# Patient Record
Sex: Female | Born: 1975 | Race: White | Hispanic: No | Marital: Married | Smoking: Former smoker
Health system: Southern US, Community
[De-identification: ages and names within clinical notes are randomized; demographics above are authoritative.]

## PROBLEM LIST (undated history)

## (undated) DIAGNOSIS — F419 Anxiety disorder, unspecified: Secondary | ICD-10-CM

## (undated) HISTORY — PX: OTHER SURGICAL HISTORY: SHX169

## (undated) HISTORY — PX: TUBAL LIGATION: SHX77

## (undated) HISTORY — PX: KNEE SURGERY: SHX244

---

## 2008-12-26 HISTORY — PX: BLADDER SURGERY: SHX569

## 2009-06-01 ENCOUNTER — Ambulatory Visit: Payer: Self-pay | Admitting: Family Medicine

## 2009-06-01 DIAGNOSIS — N200 Calculus of kidney: Secondary | ICD-10-CM | POA: Insufficient documentation

## 2009-06-01 DIAGNOSIS — F339 Major depressive disorder, recurrent, unspecified: Secondary | ICD-10-CM

## 2009-06-01 DIAGNOSIS — R51 Headache: Secondary | ICD-10-CM

## 2009-06-01 DIAGNOSIS — R519 Headache, unspecified: Secondary | ICD-10-CM | POA: Insufficient documentation

## 2009-06-23 ENCOUNTER — Ambulatory Visit: Payer: Self-pay | Admitting: Family Medicine

## 2009-06-23 DIAGNOSIS — Z87891 Personal history of nicotine dependence: Secondary | ICD-10-CM | POA: Insufficient documentation

## 2009-06-23 DIAGNOSIS — E669 Obesity, unspecified: Secondary | ICD-10-CM

## 2009-06-24 ENCOUNTER — Telehealth: Payer: Self-pay | Admitting: Family Medicine

## 2009-06-24 LAB — CONVERTED CEMR LAB
ALT: 15 units/L (ref 0–35)
Albumin: 3.7 g/dL (ref 3.5–5.2)
Basophils Relative: 0 % (ref 0.0–3.0)
CO2: 27 meq/L (ref 19–32)
Chloride: 108 meq/L (ref 96–112)
Cholesterol: 247 mg/dL — ABNORMAL HIGH (ref 0–200)
Creatinine, Ser: 0.9 mg/dL (ref 0.4–1.2)
Direct LDL: 211.6 mg/dL
Eosinophils Absolute: 0.3 10*3/uL (ref 0.0–0.7)
Eosinophils Relative: 3 % (ref 0.0–5.0)
HCT: 40.8 % (ref 36.0–46.0)
Hemoglobin: 14.2 g/dL (ref 12.0–15.0)
Lymphs Abs: 2.7 10*3/uL (ref 0.7–4.0)
MCHC: 34.7 g/dL (ref 30.0–36.0)
MCV: 89.6 fL (ref 78.0–100.0)
Monocytes Absolute: 0.6 10*3/uL (ref 0.1–1.0)
Neutro Abs: 7.5 10*3/uL (ref 1.4–7.7)
Neutrophils Relative %: 67.9 % (ref 43.0–77.0)
Potassium: 3.9 meq/L (ref 3.5–5.1)
RBC: 4.56 M/uL (ref 3.87–5.11)
TSH: 1.47 microintl units/mL (ref 0.35–5.50)
Total CHOL/HDL Ratio: 7
Total Protein: 6.8 g/dL (ref 6.0–8.3)
WBC: 11.1 10*3/uL — ABNORMAL HIGH (ref 4.5–10.5)

## 2009-07-20 ENCOUNTER — Telehealth: Payer: Self-pay | Admitting: Family Medicine

## 2009-08-14 ENCOUNTER — Telehealth: Payer: Self-pay | Admitting: *Deleted

## 2009-09-07 ENCOUNTER — Telehealth: Payer: Self-pay | Admitting: Family Medicine

## 2009-10-23 ENCOUNTER — Ambulatory Visit (HOSPITAL_COMMUNITY): Admission: RE | Admit: 2009-10-23 | Discharge: 2009-10-23 | Payer: Self-pay | Admitting: Obstetrics and Gynecology

## 2010-03-17 ENCOUNTER — Ambulatory Visit: Payer: Self-pay | Admitting: Family Medicine

## 2010-03-17 DIAGNOSIS — E785 Hyperlipidemia, unspecified: Secondary | ICD-10-CM

## 2010-03-17 DIAGNOSIS — I498 Other specified cardiac arrhythmias: Secondary | ICD-10-CM

## 2010-03-17 DIAGNOSIS — J45909 Unspecified asthma, uncomplicated: Secondary | ICD-10-CM | POA: Insufficient documentation

## 2010-03-17 LAB — CONVERTED CEMR LAB: Cholesterol, target level: 200 mg/dL

## 2010-03-19 LAB — CONVERTED CEMR LAB
HDL: 43.2 mg/dL (ref >39.00)
TSH: 1.77 microintl units/mL (ref 0.35–5.50)
Total CHOL/HDL Ratio: 5
VLDL: 26.4 mg/dL (ref 0.0–40.0)

## 2010-04-05 LAB — HM PAP SMEAR: HM Pap smear: NEGATIVE

## 2010-06-02 ENCOUNTER — Telehealth: Payer: Self-pay | Admitting: Family Medicine

## 2010-06-22 ENCOUNTER — Telehealth: Payer: Self-pay | Admitting: Family Medicine

## 2010-07-21 ENCOUNTER — Ambulatory Visit: Payer: Self-pay | Admitting: Family Medicine

## 2010-07-21 DIAGNOSIS — R300 Dysuria: Secondary | ICD-10-CM

## 2010-07-21 LAB — CONVERTED CEMR LAB
Bilirubin Urine: NEGATIVE
Glucose, Urine, Semiquant: NEGATIVE
Protein, U semiquant: NEGATIVE
pH: 5

## 2010-07-22 ENCOUNTER — Encounter: Payer: Self-pay | Admitting: Family Medicine

## 2010-10-20 ENCOUNTER — Telehealth: Payer: Self-pay | Admitting: Family Medicine

## 2011-01-25 NOTE — Assessment & Plan Note (Signed)
Summary: UTI/dm   Vital Signs:  Patient profile:   35 year old female Menstrual status:  regular Temp:     98.8 degrees F oral BP sitting:   120 / 80  (left arm) Cuff size:   regular  Vitals Entered By: Sid Falcon LPN (July 21, 2010 2:51 PM) CC: UTI pressure, pain   History of Present Illness: Patient relates 2 weeks ago possible recurrent left-sided kidney stone. She had left flank pain that was acute for about 2 days. That pain eventually subsided. Past few days burning with urination and some urgency. Temperature up to 101.3 last night. No fevers or chills. No nausea or vomiting. She has a brother who works with EMS and he gave her apparently 2 L of fluid last night overall feels slightly better.  Allergies: 1)  ! Ketorolac Tromethamine (Ketorolac Tromethamine) 2)  ! Dilaudid (Hydromorphone Hcl)  Past History:  Past Medical History: Last updated: 03/17/2010 Arthritis chicken pox Depression Headache/Migraines Hay fever/allergies kidney stones UTI Hyperlipidemia  Review of Systems  The patient denies fever, abdominal pain, hematuria, and incontinence.    Physical Exam  General:  Well-developed,well-nourished,in no acute distress; alert,appropriate and cooperative throughout examination Mouth:  Oral mucosa and oropharynx without lesions or exudates.  Teeth in good repair. Lungs:  Normal respiratory effort, chest expands symmetrically. Lungs are clear to auscultation, no crackles or wheezes. Heart:  Normal rate and regular rhythm. S1 and S2 normal without gallop, murmur, click, rub or other extra sounds. Msk:  no CVA tenderness   Impression & Recommendations:  Problem # 1:  DYSURIA (ICD-788.1) ?recent recurrent nephrolithiasis.  Rule out UTI.  Urine cx sent.  Cover with macrobid. Orders: UA Dipstick w/o Micro (manual) (41324) T-Culture, Urine ([id number])  Her updated medication list for this problem includes:    Macrobid 100 Mg Caps (Nitrofurantoin monohyd  macro) ..... One by mouth two times a day for 7 days  Problem # 2:  NEPHROLITHIASIS (ICD-592.0) If symptoms not resolved by next week may need to consider renal imaging.  Complete Medication List: 1)  Flovent Hfa 44 Mcg/act Aero (Fluticasone propionate  hfa) .... 2 puffs bid 2)  Zyrtec Allergy 10 Mg Tabs (Cetirizine hcl) .... Once daily 3)  Chantix Starting Month Pak 0.5 Mg X 11 & 1 Mg X 42 Tabs (Varenicline tartrate) .... Use as directed 4)  Chantix Continuing Month Pak 1 Mg Tabs (Varenicline tartrate) .... Use as directed 5)  Ventolin Hfa 108 (90 Base) Mcg/act Aers (Albuterol sulfate) .... 2 puffs every hour for 4 hours as needed for cough or wheezing 6)  Macrobid 100 Mg Caps (Nitrofurantoin monohyd macro) .... One by mouth two times a day for 7 days  Patient Instructions: 1)  Drink plenty of fluids up to 3-4 quarts a day. Cranberry juice is especially recommended in addition to large amounts of water. Avoid caffeine & carbonated drinks, they tend to irritate the bladder, Return in 3-5 days if you're not better: sooner if you're feeling worse.  2)  Touch base by next week if left flank pain has not fully resolved Prescriptions: MACROBID 100 MG CAPS (NITROFURANTOIN MONOHYD MACRO) one by mouth two times a day for 7 days  #14 x 0   Entered and Authorized by:   Evelena Peat MD   Signed by:   Evelena Peat MD on 07/21/2010   Method used:   Electronically to        [facility]. [phone]* (retail)  [address]       [place], [place]  [address]       Ph: [phone]       Fax: [phone]   RxID:   [id number]   Laboratory Results   Urine Tests    Routine Urinalysis   Color: lt. yellow Appearance: Clear Glucose: negative   (Normal Range: Negative) Bilirubin: negative   (Normal Range: Negative) Ketone: negative   (Normal Range: Negative) Spec. Gravity: <1.005   (Normal Range: 1.003-1.035) Blood: negative   (Normal Range:  Negative) pH: 5.0   (Normal Range: 5.0-8.0) Protein: negative   (Normal Range: Negative) Urobilinogen: 0.2   (Normal Range: 0-1) Nitrite: negative   (Normal Range: Negative) Leukocyte Esterace: small   (Normal Range: Negative)    Comments: Sid Falcon LPN  July 21, 2010 3:01 PM

## 2011-01-25 NOTE — Assessment & Plan Note (Signed)
Summary: med check/refills/cjr/pt rescd//ccm   Vital Signs:  Patient profile:   35 year old female Menstrual status:  regular Temp:     98. degrees F oral Pulse rate:   112 / minute Pulse rhythm:   regular BP sitting:   120 / 70  (left arm) Cuff size:   regular  Vitals Entered By: Sid Falcon LPN (March 17, 2010 2:12 PM) CC: Med refills, resting rapid heartrate check, left hand clot, Lipid Management   History of Present Illness: Patient here for followup multiple items as follows.  History of asthma which has been well-controlled on Advair. Uses Xopenex p.r.n. Continues to smoke. Has been able to reduce her Advair to once daily. No recent respiratory infections. Denies any dyspnea.  Recently had bladder surgery. Was noted during anesthesia that her elevated heart rate. Resting heart rate over 100 recently. Has lost some weight about 25 pounds but due to her efforts. No diarrhea no tremors. No chest pains or dizziness.  Significant caffeine consumption at baseline. Continues to smoke.  History hyperlipidemia. LDL over 200 last year. Has not been rechecked since losing weight.  Lipid Management History:      Positive NCEP/ATP III risk factors include HDL cholesterol less than 40 and current tobacco user.  Negative NCEP/ATP III risk factors include female age less than 56 years old, no history of early menopause without estrogen hormone replacement, non-diabetic, non-hypertensive, no ASHD (atherosclerotic heart disease), no prior stroke/TIA, no peripheral vascular disease, and no history of aortic aneurysm.     Allergies: 1)  ! Ketorolac Tromethamine (Ketorolac Tromethamine) 2)  ! Dilaudid (Hydromorphone Hcl)  Past History:  Past Surgical History: Last updated: 06/01/2009 Right shoulder joint  Family History: Last updated: 06/01/2009 Family History of Arthritis Family history ovary cancer Family history breast cancer Family History High cholesterol Family History  Hypertension Family history diabetes  Social History: Last updated: 06/01/2009 Occupation: [employer] Married Current Smoker Alcohol use-no  Risk Factors: Smoking Status: current (06/01/2009)  Past Medical History: Arthritis chicken pox Depression Headache/Migraines Hay fever/allergies kidney stones UTI Hyperlipidemia PMH-FH-SH reviewed for relevance  Review of Systems  The patient denies anorexia, fever, weight gain, vision loss, hoarseness, chest pain, syncope, dyspnea on exertion, peripheral edema, prolonged cough, headaches, hemoptysis, abdominal pain, melena, hematochezia, and severe indigestion/heartburn.    Physical Exam  General:  Well-developed,well-nourished,in no acute distress; alert,appropriate and cooperative throughout examination Head:  Normocephalic and atraumatic without obvious abnormalities. No apparent alopecia or balding. Eyes:  pupils equal, pupils round, and pupils reactive to light.   Ears:  External ear exam shows no significant lesions or deformities.  Otoscopic examination reveals clear canals, tympanic membranes are intact bilaterally without bulging, retraction, inflammation or discharge. Hearing is grossly normal bilaterally. Mouth:  Oral mucosa and oropharynx without lesions or exudates.  Teeth in good repair. Neck:  No deformities, masses, or tenderness noted. Lungs:  Normal respiratory effort, chest expands symmetrically. Lungs are clear to auscultation, no crackles or wheezes. Heart:  normal rate, regular rhythm, no murmur, and no gallop.   Extremities:  no edema   Impression & Recommendations:  Problem # 1:  HYPERLIPIDEMIA (ICD-272.4) recheck labs.  hopefully improved with weight loss. Orders: TLB-Lipid Panel ([id number])  Problem # 2:  ASTHMA (ICD-493.90) Assessment: Unchanged  Her updated medication list for this problem includes:    Advair Diskus 100-50 Mcg/dose Misc (Fluticasone-salmeterol) ..... One to two puffs daily as  directed    Xopenex Hfa 45 Mcg/act Aero (Levalbuterol tartrate) .Marland Kitchen... As needed  Problem # 3:  ATRIAL TACHYCARDIA (ICD-427.89) Mild. recheck TSH. Orders: TLB-TSH (Thyroid Stimulating Hormone) ([id number])  Complete Medication List: 1)  Advair Diskus 100-50 Mcg/dose Misc (Fluticasone-salmeterol) .... One to two puffs daily as directed 2)  Zyrtec Allergy 10 Mg Tabs (Cetirizine hcl) .... Once daily 3)  Xopenex Hfa 45 Mcg/act Aero (Levalbuterol tartrate) .... As needed 4)  Chantix Starting Month Pak 0.5 Mg X 11 & 1 Mg X 42 Tabs (Varenicline tartrate) .... Use as directed 5)  Chantix Continuing Month Pak 1 Mg Tabs (Varenicline tartrate) .... Use as directed  Lipid Assessment/Plan:      Based on NCEP/ATP III, the patient's risk factor category is "2 or more risk factors and a calculated 10 year CAD risk of > 20%".  The patient's lipid goals are as follows: Total cholesterol goal is 200; LDL cholesterol goal is 130; HDL cholesterol goal is 40; Triglyceride goal is 150.    Patient Instructions: 1)  Stop smoking tips: Choose a quit date. Cut down before the quit date. Decide what you will do as a substitute when you feel the urge to smoke(gum, toothpick, exercise).  2)  gradually reduce caffeine use. 3)  It is important that you exercise reguarly at least 20 minutes 5 times a week. If you develop chest pain, have severe difficulty breathing, or feel very tired, stop exercising immediately and seek medical attention.  Prescriptions: XOPENEX HFA 45 MCG/ACT AERO (LEVALBUTEROL TARTRATE) as needed  #15.0 Gram x 2   Entered and Authorized by:   Evelena Peat MD   Signed by:   Evelena Peat MD on 03/17/2010   Method used:   Electronically to        [facility]. [phone]* (retail)       [address]       [place], [place]  [address]       Ph: [phone]       Fax: [phone]   RxID:   [id number] ADVAIR DISKUS 100-50 MCG/DOSE MISC (FLUTICASONE-SALMETEROL)  one to two puffs daily as directed  #180.0 Each x 2   Entered and Authorized by:   Evelena Peat MD   Signed by:   Evelena Peat MD on 03/17/2010   Method used:   Electronically to        [facility]. [phone]* (retail)       [address]       [place], [place]  [address]       Ph: [phone]       Fax: [phone]   RxID:   [id number]   Appended Document: med check/refills/cjr/pt rescd//ccm Pt informed

## 2011-01-25 NOTE — Progress Notes (Signed)
Summary: advair sample or sub  Phone Note Call from Patient Call back at Home Phone [phone]   Summary of Call: No insurance per company - missed deadline paperwork.  Advair very expensive.  Request sample or sub that's not going to cost her $294.  [facility] [address]  Allergic to opiates Initial call taken by: Rudy Jew, RN,  June 02, 2010 1:38 PM  Follow-up for Phone Call        I recommend switch to Flovent 44 micrograms 2 puffs two times a day and 6 refills. Follow-up by: Evelena Peat MD,  June 02, 2010 1:41 PM  Additional Follow-up for Phone Call Additional follow up Details #1::        Phone Call Completed Additional Follow-up by: Rudy Jew, RN,  June 02, 2010 3:17 PM    New/Updated Medications: FLOVENT HFA 44 MCG/ACT AERO (FLUTICASONE PROPIONATE  HFA) 2 puffs bid Prescriptions: FLOVENT HFA 44 MCG/ACT AERO (FLUTICASONE PROPIONATE  HFA) 2 puffs bid  #1 x 6   Entered by:   Rudy Jew, RN   Authorized by:   Evelena Peat MD   Signed by:   Rudy Jew, RN on 06/02/2010   Method used:   Electronically to        [facility]. [phone]* (retail)       [address]       [place], [place]  [address]       Ph: [phone]       Fax: [phone]   RxID:   [id number]

## 2011-01-25 NOTE — Progress Notes (Signed)
Summary: med for UTI  Phone Note Call from Patient Call back at Home Phone [phone]   Caller: vm 3:29 Summary of Call: UTI, get them frequently, & due to get on plane & bouncing locations around next 3-4 days.  Can you call in med to [facility].  I can pick it up where I'll be.   Initial call taken by: Rudy Jew, RN,  October 20, 2010 3:43 PM  Follow-up for Phone Call        I did not get chance to review this message until after 5pm. Follow-up by: Evelena Peat MD,  October 20, 2010 8:33 PM  Additional Follow-up for Phone Call Additional follow up Details #1::        Pt still having symptoms, out of town, requesting RX. Sid Falcon LPN  October 22, 2010 12:41 PM     Additional Follow-up for Phone Call Additional follow up Details #2::    macrobid one by mouth two times a day for 5 days. Needs f/u if symptoms persist. Follow-up by: Evelena Peat MD,  October 22, 2010 1:03 PM  Prescriptions: MACROBID 100 MG CAPS (NITROFURANTOIN MONOHYD MACRO) one by mouth two times a day for 7 days  #14 x 0   Entered by:   Sid Falcon LPN   Authorized by:   Evelena Peat MD   Signed by:   Sid Falcon LPN on 24/40/1027   Method used:   Electronically to        [facility]. [phone]* (retail)       [address]       [place], [place]  [address]       Ph: [phone]       Fax: [phone]   RxID:   [id number]

## 2011-01-25 NOTE — Progress Notes (Signed)
Summary: new rx request  Phone Note Call from Patient Call back at Home Phone [phone]   Caller: Patient---live call Summary of Call: On Xopenex.Too expensive has no ins. Would like to change to Albuterol. please send to [facility] on [address]. Would like this asap, please. Initial call taken by: Warnell Forester,  June 22, 2010 1:28 PM  Follow-up for Phone Call        [facility] to change to ventolin or ProAir 2 puffs q 4 hours as needed cough or wheeze.  #1 with 3 refills. Follow-up by: Evelena Peat MD,  June 22, 2010 2:44 PM  Additional Follow-up for Phone Call Additional follow up Details #1::        Phone Call Completed Additional Follow-up by: Kern Reap CMA Duncan Dull),  June 23, 2010 9:20 AM    New/Updated Medications: VENTOLIN HFA 108 (90 BASE) MCG/ACT AERS (ALBUTEROL SULFATE) 2 puffs every hour for 4 hours as needed for cough or wheezing Prescriptions: VENTOLIN HFA 108 (90 BASE) MCG/ACT AERS (ALBUTEROL SULFATE) 2 puffs every hour for 4 hours as needed for cough or wheezing  #1 x 3   Entered by:   Kern Reap CMA (AAMA)   Authorized by:   Evelena Peat MD   Signed by:   Kern Reap CMA (AAMA) on 06/23/2010   Method used:   Electronically to        [facility]. [phone]* (retail)       [address]       [place], [place]  [address]       Ph: [phone]       Fax: [phone]   RxID:   [id number]

## 2011-02-17 ENCOUNTER — Other Ambulatory Visit: Payer: Self-pay | Admitting: Family Medicine

## 2011-03-31 LAB — COMPREHENSIVE METABOLIC PANEL
AST: 21 U/L (ref 0–37)
Albumin: 3.9 g/dL (ref 3.5–5.2)
BUN: 7 mg/dL (ref 6–23)
Chloride: 103 mEq/L (ref 96–112)
Creatinine, Ser: 0.91 mg/dL (ref 0.4–1.2)
GFR calc Af Amer: >60 mL/min (ref >60)
Potassium: 4 mEq/L (ref 3.5–5.1)
Total Bilirubin: 0.5 mg/dL (ref 0.3–1.2)
Total Protein: 7.1 g/dL (ref 6.0–8.3)

## 2011-03-31 LAB — CBC
HCT: 43.9 % (ref 36.0–46.0)
MCV: 90.6 fL (ref 78.0–100.0)
Platelets: 360 10*3/uL (ref 150–400)
RDW: 12.9 % (ref 11.5–15.5)
WBC: 11.9 10*3/uL — ABNORMAL HIGH (ref 4.0–10.5)

## 2011-03-31 LAB — URINALYSIS, ROUTINE W REFLEX MICROSCOPIC
Ketones, ur: NEGATIVE mg/dL
Protein, ur: NEGATIVE mg/dL
Urobilinogen, UA: 0.2 mg/dL (ref 0.0–1.0)

## 2011-03-31 LAB — PREGNANCY, URINE: Preg Test, Ur: NEGATIVE

## 2011-06-06 ENCOUNTER — Encounter: Payer: Self-pay | Admitting: Family Medicine

## 2011-06-06 ENCOUNTER — Ambulatory Visit (INDEPENDENT_AMBULATORY_CARE_PROVIDER_SITE_OTHER): Payer: Self-pay | Admitting: Family Medicine

## 2011-06-06 VITALS — BP 112/80 | HR 104 | Temp 98.2°F | Resp 12

## 2011-06-06 DIAGNOSIS — M25539 Pain in unspecified wrist: Secondary | ICD-10-CM

## 2011-06-06 DIAGNOSIS — J45909 Unspecified asthma, uncomplicated: Secondary | ICD-10-CM

## 2011-06-06 DIAGNOSIS — R52 Pain, unspecified: Secondary | ICD-10-CM

## 2011-06-06 DIAGNOSIS — F410 Panic disorder [episodic paroxysmal anxiety] without agoraphobia: Secondary | ICD-10-CM

## 2011-06-06 MED ORDER — FLUTICASONE-SALMETEROL 250-50 MCG/DOSE IN AEPB: 1.0000 | INHALATION_SPRAY | RESPIRATORY_TRACT | Status: DC

## 2011-06-06 MED ORDER — LORAZEPAM 0.5 MG PO TABS: 0.5000 mg | ORAL_TABLET | Freq: Four times a day (QID) | ORAL | Status: AC | PRN

## 2011-06-06 MED ORDER — SERTRALINE HCL 50 MG PO TABS: 50.0000 mg | ORAL_TABLET | Freq: Every day | ORAL | Status: DC

## 2011-06-06 NOTE — Progress Notes (Signed)
  Subjective:    Patient ID: Amy [name], female    DOB: 1976/03/31, 35 y.o.   MRN: [id number]  HPI Patient seen today for the following issues.  Patient has right wrist pain. Saturday she felt a popping sensation along the volar surface mid aspect of wrist. Since then she's had some right thumb pain and noticed some bruising on the wrist. She went to urgent care and was given a thumb spica splint. No x-rays done. She using Advil and ice. Intermittent numbness involving all fingertips. No neck pain. Pain with wrist extension and flexion. Able to move all digits well.  History of asthma which is apparently mild persistent. Needs refills of Advair which she takes just once daily. Seems to be controlling asthma fairly well. Rare use of Ventolin.  Patient history panic disorder. More frequent episodes recently about one to 2 times per week. Frequent wakes up at night with dyspnea and hyperventilation. Occasional nonexertional chest pain. Has previously been on prevention medicines but not for some time. No recent stressors. No depressive symptoms.   Review of Systems  Constitutional: Negative for appetite change and unexpected weight change.  Respiratory: Negative for cough and shortness of breath.   Cardiovascular: Negative for chest pain.  Gastrointestinal: Negative for abdominal pain.  Musculoskeletal: Negative for back pain.  Skin: Negative for rash.  Neurological: Negative for seizures, syncope and headaches.  Hematological: Negative for adenopathy. Does not bruise/bleed easily.       Objective:   Physical Exam  Constitutional: She is oriented to person, place, and time. She appears well-developed and well-nourished. No distress.  Cardiovascular: Normal rate, regular rhythm and normal heart sounds.   Pulmonary/Chest: Effort normal and breath sounds normal. No respiratory distress. She has no wheezes. She has no rales.  Musculoskeletal: She exhibits no edema.       Right wrist reveals  some mild ecchymosis on the distal ventral aspect of the wrist. She has pain with flexion and extension but no impairment with mobility. She has full range of motion in all digits of the hand. She has pain with abduction and adduction of the right thumb. No palpable deformity. No bony tenderness. No visible swelling. No erythema or warmth.  Neurological: She is alert and oriented to person, place, and time. No cranial nerve deficit.  Psychiatric: She has a normal mood and affect.          Assessment & Plan:  #1 right wrist pain. Suspect tendon injury/strain. She has full range of motion in all digits. Continue thumb spica splint. Continue Advil and icing. Reassess 2 weeks if no better #2 history of panic disorder. Start sertraline 50 mg daily and lorazepam 0.5 mg every 6 hours for severe anxiety symptoms and reassess one month #3 asthma. Stable. Refilled Advair for one year

## 2011-06-30 ENCOUNTER — Ambulatory Visit: Payer: Self-pay | Admitting: Family Medicine

## 2012-01-26 ENCOUNTER — Emergency Department (HOSPITAL_COMMUNITY): Payer: 59

## 2012-01-26 ENCOUNTER — Other Ambulatory Visit: Payer: Self-pay

## 2012-01-26 ENCOUNTER — Encounter (HOSPITAL_COMMUNITY): Payer: Self-pay | Admitting: Emergency Medicine

## 2012-01-26 ENCOUNTER — Emergency Department (HOSPITAL_COMMUNITY)
Admission: EM | Admit: 2012-01-26 | Discharge: 2012-01-27 | Disposition: A | Discharge status: Home / Self Care | Payer: 59 | Attending: Emergency Medicine | Admitting: Emergency Medicine

## 2012-01-26 DIAGNOSIS — R0682 Tachypnea, not elsewhere classified: Secondary | ICD-10-CM | POA: Insufficient documentation

## 2012-01-26 DIAGNOSIS — F411 Generalized anxiety disorder: Secondary | ICD-10-CM | POA: Insufficient documentation

## 2012-01-26 DIAGNOSIS — F17200 Nicotine dependence, unspecified, uncomplicated: Secondary | ICD-10-CM | POA: Insufficient documentation

## 2012-01-26 DIAGNOSIS — R0789 Other chest pain: Secondary | ICD-10-CM | POA: Insufficient documentation

## 2012-01-26 DIAGNOSIS — R0602 Shortness of breath: Secondary | ICD-10-CM | POA: Insufficient documentation

## 2012-01-26 DIAGNOSIS — J45901 Unspecified asthma with (acute) exacerbation: Secondary | ICD-10-CM

## 2012-01-26 DIAGNOSIS — R000 Tachycardia, unspecified: Secondary | ICD-10-CM | POA: Insufficient documentation

## 2012-01-26 DIAGNOSIS — J45909 Unspecified asthma, uncomplicated: Secondary | ICD-10-CM | POA: Insufficient documentation

## 2012-01-26 MED ORDER — IPRATROPIUM BROMIDE 0.02 % IN SOLN
0.5000 mg | Freq: Once | RESPIRATORY_TRACT | Status: DC
  Filled 2012-01-26: qty 2.5

## 2012-01-26 MED ORDER — ALBUTEROL (5 MG/ML) CONTINUOUS INHALATION SOLN
15.0000 mg/h | INHALATION_SOLUTION | Freq: Once | RESPIRATORY_TRACT | Status: AC
  Administered 2012-01-26: 15 mg/h via RESPIRATORY_TRACT

## 2012-01-26 MED ORDER — ALBUTEROL SULFATE (5 MG/ML) 0.5% IN NEBU
5.0000 mg | INHALATION_SOLUTION | Freq: Once | RESPIRATORY_TRACT | Status: DC
  Filled 2012-01-26: qty 3
  Filled 2012-01-26: qty 1

## 2012-01-26 MED ORDER — PREDNISONE 20 MG PO TABS
60.0000 mg | ORAL_TABLET | Freq: Once | ORAL | Status: AC
  Administered 2012-01-26: 60 mg via ORAL
  Filled 2012-01-26: qty 3

## 2012-01-26 MED ORDER — ALBUTEROL SULFATE (5 MG/ML) 0.5% IN NEBU
5.0000 mg | INHALATION_SOLUTION | Freq: Once | RESPIRATORY_TRACT | Status: AC
  Administered 2012-01-26: 5 mg via RESPIRATORY_TRACT

## 2012-01-26 MED ORDER — IPRATROPIUM BROMIDE 0.02 % IN SOLN
0.5000 mg | Freq: Once | RESPIRATORY_TRACT | Status: AC
  Administered 2012-01-26: 0.5 mg via RESPIRATORY_TRACT

## 2012-01-26 NOTE — ED Notes (Addendum)
Pt placed on continuous cardiac/ pulse ox monitoring.

## 2012-01-26 NOTE — ED Provider Notes (Addendum)
History     CSN: [id number]  Arrival date & time 01/26/12  2209   First MD Initiated Contact with Patient 01/26/12 2242      Chief Complaint  Patient presents with  . Shortness of Breath    (Consider location/radiation/quality/duration/timing/severity/associated sxs/prior treatment) Patient is a 36 y.o. female presenting with shortness of breath. The history is provided by the patient and the spouse. The history is limited by the condition of the patient.  Shortness of Breath  Associated symptoms include cough and shortness of breath. Pertinent negatives include no fever and no wheezing.   the patient is a 36 year old, female, with a history of asthma.  She also smokes cigarettes.  She presents to emergency department complaining of a nonproductive cough, and severe shortness of breath with heaviness in her chest.  Her symptoms started early today.  She has not had fevers, chills, or diaphoresis.  She denies taking birth control pills.  She has not had any recent trip or surgery and she denies leg pain or swelling.  Level V caveat applies for urgent need for intervention, do to respiratory distress  Past Medical History  Diagnosis Date  . Asthma     Past Surgical History  Procedure Date  . Tubal ligation     No family history on file.  History  Substance Use Topics  . Smoking status: Current Everyday Smoker -- 1.0 packs/day for 7 years    Types: Cigarettes  . Smokeless tobacco: Not on file  . Alcohol Use: No    OB History    Grav Para Term Preterm Abortions TAB SAB Ect Mult Living                  Review of Systems  Constitutional: Negative for fever and chills.  HENT: Negative for congestion.   Respiratory: Positive for cough, chest tightness and shortness of breath. Negative for wheezing.   Cardiovascular: Negative for leg swelling.  Gastrointestinal: Negative for nausea and vomiting.  All other systems reviewed and are negative.    Allergies    Hydromorphone hcl and Ketorolac tromethamine  Home Medications   Current Outpatient Rx  Name Route Sig Dispense Refill  . FLUTICASONE-SALMETEROL 250-50 MCG/DOSE IN AEPB Inhalation Inhale 1 puff into the lungs 1 dose over 24 hours. One puff at bedtime 1 each 11  . GUAIFENESIN ER 600 MG PO TB12 Oral Take 600 mg by mouth 2 (two) times daily as needed. For congestion.    . VENTOLIN HFA 108 (90 BASE) MCG/ACT IN AERS  INHALE 2 PUFFS BY MOUTH EVERY HOUR FOR 4 HOURS AS NEEDED FOR COUGH OR WHEEZING 3 Inhaler 3    BP 117/84  Pulse 138  Temp(Src) 98 F (36.7 C) (Oral)  Resp 16  Wt 180 lb (81.647 kg)  SpO2 99%  Physical Exam  Constitutional: She is oriented to person, place, and time. She appears well-developed and well-nourished. She appears distressed.  HENT:  Head: Normocephalic and atraumatic.  Eyes: Pupils are equal, round, and reactive to light.  Neck: Normal range of motion.  Cardiovascular:  No murmur heard.      Tachycardia  Pulmonary/Chest: She is in respiratory distress. She has no wheezes. She has no rales.       Tachypnea respiratory distress.  Cannot speak in full sentences be due to respiratory distress  Musculoskeletal: Normal range of motion. She exhibits no edema.  Neurological: She is alert and oriented to person, place, and time.  Skin: Skin is warm and  dry.  Psychiatric: Judgment and thought content normal.       Anxious    ED Course  Procedures (including critical care time) 36 year old, female, smokes cigarettes and has severe asthma, presents with nonproductive cough, and shortness of breath.  She does not have wheezing.  She has no risk factors for pulmonary embolism.  However, she is take tachycardic, and tachypnea.  For shortness of breath.  We will give her nebulizer treatments perform a chest x-ray, and check a d-dimer, for possible PE, pneumonia, or just an asthma exacerbation.   Labs Reviewed  D-DIMER, QUANTITATIVE   No results found.   No  diagnosis found.  ED ECG REPORT   Date: 01/26/2012  EKG Time: 11:44 PM  Rate: 112  Rhythm: sinus tachycardia,   Axis: nl  Intervals:none  ST&T Change: none  Narrative Interpretation: st            Feels better but still cannot speak in full sentences. Has wheezing now. Will give CAT for 1 hr.   11:49 PM Turned over to Dr. Clarene Duke.  She will check after hr neb and make final dispo decision.  MDM  Asthma exacerbation. No evidence pe or pneumonia        Nicholes Stairs, MD 01/26/12 1610  Nicholes Stairs, MD 01/26/12 2345  Nicholes Stairs, MD 01/26/12 [phone]

## 2012-01-26 NOTE — ED Notes (Signed)
Pt alert, presents with family, c/o sob, onset several days ago, worse today, resp even unlabored, skin pwd

## 2012-01-27 MED ORDER — ALBUTEROL SULFATE HFA 108 (90 BASE) MCG/ACT IN AERS
2.0000 | INHALATION_SPRAY | RESPIRATORY_TRACT | Status: DC
  Filled 2012-01-27: qty 6.7

## 2012-01-27 MED ORDER — PREDNISONE 20 MG PO TABS: 20.0000 mg | ORAL_TABLET | Freq: Every day | ORAL | Status: AC

## 2012-01-27 NOTE — ED Provider Notes (Signed)
35yo F, c/o gradual onset and persistence of constant cough and SOB that began earlier today.  Used home MDI w/o relief.  Has had prednisone and neb in ED with improvement in symptoms and wants to go home now.  Has ambulated with O2 Sats 100% R/A, lungs CTA bilat, speaking full sentences with ease.  Will give rx prednisone, dose MDI/spacer here.  Dx testing d/w pt and family.  Questions answered.  Verb understanding, agreeable to d/c home with outpt f/u.  Results for orders placed during the hospital encounter of 01/26/12  D-DIMER, QUANTITATIVE      Component Value Range   D-Dimer, Quant 0.25  0.00 - 0.48 (ug/mL-FEU)   Dg Chest Port 1 View 01/26/2012  *RADIOLOGY REPORT*  Clinical Data: Shortness of breath, cough, question pneumonia  PORTABLE CHEST - 1 VIEW  Comparison: Portable exam 2300 hours without priors for comparison  Findings: Normal heart size, mediastinal contours, and pulmonary vascularity. Lungs clear. No pleural effusion or pneumothorax. Bones unremarkable. Question prior right rotator cuff repair.  IMPRESSION: No acute abnormalities.  Original Report Authenticated By: Lollie Marrow, M.D.     Samuel Jester Allison Quarry, DO 01/27/12 [phone]

## 2012-01-27 NOTE — ED Notes (Signed)
Pt was 98% on RA while ambulating. Dr. Harrie Foreman notified

## 2012-03-17 ENCOUNTER — Other Ambulatory Visit: Payer: Self-pay | Admitting: Family Medicine

## 2012-06-26 ENCOUNTER — Other Ambulatory Visit: Payer: Self-pay | Admitting: Family Medicine

## 2012-10-18 ENCOUNTER — Other Ambulatory Visit: Payer: Self-pay | Admitting: Family Medicine

## 2012-10-19 ENCOUNTER — Other Ambulatory Visit: Payer: Self-pay | Admitting: Family Medicine

## 2012-12-18 ENCOUNTER — Other Ambulatory Visit: Payer: Self-pay | Admitting: Family Medicine

## 2013-03-11 ENCOUNTER — Other Ambulatory Visit: Payer: Self-pay | Admitting: Family Medicine

## 2013-03-13 ENCOUNTER — Other Ambulatory Visit: Payer: Self-pay | Admitting: Family Medicine

## 2013-03-15 ENCOUNTER — Telehealth: Payer: Self-pay | Admitting: Family Medicine

## 2013-03-15 MED ORDER — FLUTICASONE-SALMETEROL 250-50 MCG/DOSE IN AEPB: INHALATION_SPRAY | RESPIRATORY_TRACT | Status: DC

## 2013-03-15 NOTE — Telephone Encounter (Signed)
Pt would like to know if you would refill her ADVAIR DISKUS 250-50 MCG/ Pt aware need for appt and made a cpx for April 3. Pt is out of med..   [facility] [address]

## 2013-03-22 ENCOUNTER — Encounter: Payer: 59 | Admitting: Family Medicine

## 2013-03-28 ENCOUNTER — Encounter: Payer: 59 | Admitting: Family Medicine

## 2013-04-05 ENCOUNTER — Ambulatory Visit (INDEPENDENT_AMBULATORY_CARE_PROVIDER_SITE_OTHER): Payer: 59 | Admitting: Family Medicine

## 2013-04-05 ENCOUNTER — Encounter: Payer: Self-pay | Admitting: Family Medicine

## 2013-04-05 VITALS — BP 110/80 | HR 84 | Temp 98.3°F | Resp 12 | Ht 62.5 in | Wt 203.0 lb

## 2013-04-05 DIAGNOSIS — Z0000 Encounter for general adult medical examination without abnormal findings: Secondary | ICD-10-CM

## 2013-04-05 LAB — HEPATIC FUNCTION PANEL
AST: 20 U/L (ref 0–37)
Alkaline Phosphatase: 63 U/L (ref 39–117)
Bilirubin, Direct: 0.1 mg/dL (ref 0.0–0.3)
Total Bilirubin: 0.3 mg/dL (ref 0.3–1.2)

## 2013-04-05 LAB — CBC WITH DIFFERENTIAL/PLATELET
Basophils Relative: 0.8 % (ref 0.0–3.0)
Eosinophils Relative: 3.3 % (ref 0.0–5.0)
Lymphocytes Relative: 24.4 % (ref 12.0–46.0)
Monocytes Absolute: 0.4 10*3/uL (ref 0.1–1.0)
Monocytes Relative: 4.6 % (ref 3.0–12.0)
Neutrophils Relative %: 66.9 % (ref 43.0–77.0)
Platelets: 321 10*3/uL (ref 150.0–400.0)
RBC: 4.68 Mil/uL (ref 3.87–5.11)
WBC: 9.7 10*3/uL (ref 4.5–10.5)

## 2013-04-05 LAB — BASIC METABOLIC PANEL
CO2: 21 mEq/L (ref 19–32)
Chloride: 102 mEq/L (ref 96–112)
Glucose, Bld: 83 mg/dL (ref 70–99)
Sodium: 134 mEq/L — ABNORMAL LOW (ref 135–145)

## 2013-04-05 LAB — LIPID PANEL
HDL: 35.5 mg/dL — ABNORMAL LOW (ref >39.00)
Total CHOL/HDL Ratio: 7
Triglycerides: 81 mg/dL (ref 0.0–149.0)
VLDL: 16.2 mg/dL (ref 0.0–40.0)

## 2013-04-05 LAB — TSH: TSH: 1.73 u[IU]/mL (ref 0.35–5.50)

## 2013-04-05 LAB — LDL CHOLESTEROL, DIRECT: Direct LDL: 202.8 mg/dL

## 2013-04-05 NOTE — Patient Instructions (Addendum)
Schedule with gyn for pap sometime this year Schedule for skin lesion excision

## 2013-04-05 NOTE — Progress Notes (Signed)
  Subjective:    Patient ID: Amy [name], female    DOB: 10-Sep-1976, 37 y.o.   MRN: [id number]  HPI Patient complete physical No Pap smear in 3 years and she plans to establish with GYN soon. She has history of asthma which has been controlled with Advair. Takes as needed Ventolin. Tetanus up to date. Requesting repeat lab work. No consistent exercise.  Slightly painful bump about right elbow. Present for several months. She tried puncturing this but no drainage. No history of known foreign body  She's trying to quit smoking. She is using electronic vapor cigarette. Previous intolerance to Chantix.  Past Medical History  Diagnosis Date  . Asthma    Past Surgical History  Procedure Laterality Date  . Tubal ligation    . Bladder surgery  2010    reports that she has been smoking Cigarettes.  She has a 7 pack-year smoking history. She does not have any smokeless tobacco history on file. She reports that she does not drink alcohol. Her drug history is not on file. family history includes Diabetes in her paternal grandfather. Allergies  Allergen Reactions  . Hydromorphone Hcl     REACTION: itiching, swelling  . Ketorolac Tromethamine     REACTION: hives, itiching      Review of Systems  Constitutional: Negative for fever, activity change, appetite change, fatigue and unexpected weight change.  HENT: Negative for hearing loss, ear pain, sore throat and trouble swallowing.   Eyes: Negative for visual disturbance.  Respiratory: Negative for cough and shortness of breath.   Cardiovascular: Negative for chest pain and palpitations.  Gastrointestinal: Negative for abdominal pain, diarrhea, constipation and blood in stool.  Genitourinary: Negative for dysuria and hematuria.  Musculoskeletal: Negative for myalgias, back pain and arthralgias.  Skin: Negative for rash.  Neurological: Negative for dizziness, syncope and headaches.  Hematological: Negative for adenopathy.   Psychiatric/Behavioral: Negative for confusion and dysphoric mood.       Objective:   Physical Exam  Constitutional: She is oriented to person, place, and time. She appears well-developed and well-nourished.  HENT:  Head: Normocephalic and atraumatic.  Eyes: EOM are normal. Pupils are equal, round, and reactive to light.  Neck: Normal range of motion. Neck supple. No thyromegaly present.  Cardiovascular: Normal rate, regular rhythm and normal heart sounds.   No murmur heard. Pulmonary/Chest: Breath sounds normal. No respiratory distress. She has no wheezes. She has no rales.  Abdominal: Soft. Bowel sounds are normal. She exhibits no distension and no mass. There is no tenderness. There is no rebound and no guarding.  Genitourinary:  Per gyn  Musculoskeletal: Normal range of motion. She exhibits no edema.  Lymphadenopathy:    She has no cervical adenopathy.  Neurological: She is alert and oriented to person, place, and time. She displays normal reflexes. No cranial nerve deficit.  Skin: No rash noted.  Right elbow reveals approximately 5-6 mm firm nodular lesion. Slightly scabbed surface  Psychiatric: She has a normal mood and affect. Her behavior is normal. Judgment and thought content normal.          Assessment & Plan:  Complete physical. Labs ordered. Tetanus up-to-date. Schedule followup with gynecologist for Pap smear. She prefers to do through gynecologist and not here. Schedule followup for skin lesion excision right elbow -suspect benign cyst or fibroma

## 2013-05-10 ENCOUNTER — Other Ambulatory Visit: Payer: Self-pay | Admitting: Family Medicine

## 2013-05-10 MED ORDER — FLUTICASONE-SALMETEROL 250-50 MCG/DOSE IN AEPB: INHALATION_SPRAY | RESPIRATORY_TRACT | Status: DC

## 2013-05-13 ENCOUNTER — Other Ambulatory Visit: Payer: Self-pay | Admitting: Family Medicine

## 2013-06-19 ENCOUNTER — Encounter: Payer: Self-pay | Admitting: Family Medicine

## 2013-06-19 ENCOUNTER — Ambulatory Visit (INDEPENDENT_AMBULATORY_CARE_PROVIDER_SITE_OTHER): Payer: 59 | Admitting: Family Medicine

## 2013-06-19 VITALS — BP 128/86 | HR 108 | Temp 98.3°F | Resp 16 | Wt 200.0 lb

## 2013-06-19 DIAGNOSIS — J029 Acute pharyngitis, unspecified: Secondary | ICD-10-CM

## 2013-06-19 NOTE — Progress Notes (Signed)
  Subjective:    Patient ID: Amy [name], female    DOB: 27-Aug-1976, 37 y.o.   MRN: [id number]  HPI Acute visit Patient seen with bilateral ear ache and sore throat for the past 5 days Initially she noticed sore throat just on one side but now bilateral. She denies any headaches, fevers, chills, or sinus congestion. Denies any cough. Mild relief with over-the-counter analgesics. No sick exposures. No skin rash. No body aches. No swallowing difficulties.  Past Medical History  Diagnosis Date  . Asthma    Past Surgical History  Procedure Laterality Date  . Tubal ligation    . Bladder surgery  2010    reports that she has been smoking Cigarettes.  She has a 7 pack-year smoking history. She does not have any smokeless tobacco history on file. She reports that she does not drink alcohol. Her drug history is not on file. family history includes Diabetes in her paternal grandfather. Allergies  Allergen Reactions  . Hydromorphone Hcl     REACTION: itiching, swelling No opioids.  . Ketorolac Tromethamine     REACTION: hives, itiching      Review of Systems  Constitutional: Negative for fever and chills.  HENT: Positive for ear pain and sore throat. Negative for congestion.   Respiratory: Negative for cough.   Neurological: Negative for dizziness and headaches.       Objective:   Physical Exam  Constitutional: She appears well-developed and well-nourished.  HENT:  Right Ear: External ear normal.  Left Ear: External ear normal.  Patient is minimally enlarged symmetric tonsils with whitish exudate bilaterally  Neck: Neck supple.  Patient has tender anterior cervical nodes bilaterally  Cardiovascular: Normal rate and regular rhythm.   Pulmonary/Chest: Breath sounds normal. No respiratory distress. She has no wheezes. She has no rales.          Assessment & Plan:  Exudative pharyngitis. Suspect viral. Check rapid strep. If negative, treat symptomatically.

## 2013-06-19 NOTE — Patient Instructions (Addendum)
Viral Pharyngitis Viral pharyngitis is a viral infection that produces redness, pain, and swelling (inflammation) of the throat. It can spread from person to person (contagious). CAUSES Viral pharyngitis is caused by inhaling a large amount of certain germs called viruses. Many different viruses cause viral pharyngitis. SYMPTOMS Symptoms of viral pharyngitis include:  Sore throat.  Tiredness.  Stuffy nose.  Low-grade fever.  Congestion.  Cough. TREATMENT Treatment includes rest, drinking plenty of fluids, and the use of over-the-counter medication (approved by your caregiver). HOME CARE INSTRUCTIONS   Drink enough fluids to keep your urine clear or pale yellow.  Eat soft, cold foods such as ice cream, frozen ice pops, or gelatin dessert.  Gargle with warm salt water (1 tsp salt per 1 qt of water).  If over age 7, throat lozenges may be used safely.  Only take over-the-counter or prescription medicines for pain, discomfort, or fever as directed by your caregiver. Do not take aspirin. To help prevent spreading viral pharyngitis to others, avoid:  Mouth-to-mouth contact with others.  Sharing utensils for eating and drinking.  Coughing around others. SEEK MEDICAL CARE IF:   You are better in a few days, then become worse.  You have a fever or pain not helped by pain medicines.  There are any other changes that concern you. Document Released: 09/21/2005 Document Revised: 03/05/2012 Document Reviewed: 02/17/2011 [employer] Patient Information 2014 [employer].  

## 2013-08-19 ENCOUNTER — Emergency Department (HOSPITAL_COMMUNITY)
Admission: EM | Admit: 2013-08-19 | Discharge: 2013-08-20 | Disposition: A | Discharge status: Home / Self Care | Payer: 59 | Attending: Emergency Medicine | Admitting: Emergency Medicine

## 2013-08-19 ENCOUNTER — Emergency Department (HOSPITAL_COMMUNITY): Payer: 59

## 2013-08-19 ENCOUNTER — Encounter (HOSPITAL_COMMUNITY): Payer: Self-pay | Admitting: Emergency Medicine

## 2013-08-19 DIAGNOSIS — R1031 Right lower quadrant pain: Secondary | ICD-10-CM

## 2013-08-19 DIAGNOSIS — Z79899 Other long term (current) drug therapy: Secondary | ICD-10-CM | POA: Insufficient documentation

## 2013-08-19 DIAGNOSIS — F17200 Nicotine dependence, unspecified, uncomplicated: Secondary | ICD-10-CM | POA: Insufficient documentation

## 2013-08-19 DIAGNOSIS — J45909 Unspecified asthma, uncomplicated: Secondary | ICD-10-CM | POA: Insufficient documentation

## 2013-08-19 DIAGNOSIS — Z3202 Encounter for pregnancy test, result negative: Secondary | ICD-10-CM | POA: Insufficient documentation

## 2013-08-19 LAB — CBC WITH DIFFERENTIAL/PLATELET
Basophils Relative: 1 % (ref 0–1)
HCT: 40.9 % (ref 36.0–46.0)
Hemoglobin: 14.3 g/dL (ref 12.0–15.0)
MCHC: 35 g/dL (ref 30.0–36.0)
Monocytes Absolute: 0.7 10*3/uL (ref 0.1–1.0)
Monocytes Relative: 5 % (ref 3–12)
Neutro Abs: 9.1 10*3/uL — ABNORMAL HIGH (ref 1.7–7.7)

## 2013-08-19 MED ORDER — IOHEXOL 300 MG/ML  SOLN
50.0000 mL | Freq: Once | INTRAMUSCULAR | Status: AC | PRN
  Administered 2013-08-19: 50 mL via ORAL

## 2013-08-19 NOTE — ED Provider Notes (Signed)
CSN: [id number]     Arrival date & time 08/19/13  2225 History   First MD Initiated Contact with Patient 08/19/13 2306     Chief Complaint  Patient presents with  . Abdominal Pain   (Consider location/radiation/quality/duration/timing/severity/associated sxs/prior Treatment) HPI Patient is a 37 year old female who presents to emergency department complaining of abdominal pain. Patient states abdominal pain started swelling this morning while she was sitting at her desk. Patient states pain has been constant since then it has worsened throughout the day. Patient states pain is mainly in the right lower quadrant. States he does not radiate. No associated nausea, vomiting, fever, urinary symptoms, vaginal discharge or bleeding. Patient denies being pregnant. Patient states she has history of C-section and has a bladder mesh placed transvaginally for bladder prolapse. States pain she believes is right over 1 and one of her anchors is. Patient did not take any medications for this prior to their arrival. Patient states pain is worsened with movement, cough, lifting right leg, palpation. Patient states and does feel better if she presses on the area and Holter there but worse when she releases it.  Past Medical History  Diagnosis Date  . Asthma    Past Surgical History  Procedure Laterality Date  . Tubal ligation    . Bladder surgery  2010   Family History  Problem Relation Age of Onset  . Diabetes Paternal Grandfather    History  Substance Use Topics  . Smoking status: Current Every Day Smoker -- 1.00 packs/day for 7 years    Types: Cigarettes  . Smokeless tobacco: Not on file  . Alcohol Use: No   OB History   Grav Para Term Preterm Abortions TAB SAB Ect Mult Living                 Review of Systems  Constitutional: Negative for fever and chills.  HENT: Negative for neck pain and neck stiffness.   Respiratory: Negative for cough, chest tightness and shortness of breath.    Cardiovascular: Negative for chest pain, palpitations and leg swelling.  Gastrointestinal: Positive for abdominal pain. Negative for nausea, vomiting and diarrhea.  Genitourinary: Negative for dysuria, flank pain, vaginal bleeding, vaginal discharge, vaginal pain and pelvic pain.  Musculoskeletal: Negative for myalgias and arthralgias.  Skin: Negative for rash.  Neurological: Negative for dizziness, weakness and headaches.  All other systems reviewed and are negative.    Allergies  Hydromorphone hcl and Ketorolac tromethamine  Home Medications   Current Outpatient Rx  Name  Route  Sig  Dispense  Refill  . albuterol (PROVENTIL HFA;VENTOLIN HFA) 108 (90 BASE) MCG/ACT inhaler   Inhalation   Inhale 2 puffs into the lungs every 4 (four) hours as needed for wheezing or shortness of breath.         . cetirizine (ZYRTEC) 10 MG tablet   Oral   Take 10 mg by mouth as needed for allergies.         . diphenhydrAMINE (BENADRYL) 25 MG tablet   Oral   Take 25 mg by mouth as needed for itching.         . Fluticasone-Salmeterol (ADVAIR) 250-50 MCG/DOSE AEPB   Inhalation   Inhale 1 puff into the lungs at bedtime.         Marland Kitchen ibuprofen (ADVIL,MOTRIN) 200 MG tablet   Oral   Take 800 mg by mouth every 6 (six) hours as needed for pain.          BP 120/85  Pulse 106  Temp(Src) 98.7 F (37.1 C) (Oral)  Resp 36  Ht 5\' 2"  (1.575 m)  Wt 200 lb (90.719 kg)  BMI 36.57 kg/m2  SpO2 98%  LMP 08/05/2013 Physical Exam  Nursing note and vitals reviewed. Constitutional: She is oriented to person, place, and time. She appears well-developed and well-nourished. No distress.  HENT:  Head: Normocephalic.  Eyes: Conjunctivae are normal.  Neck: Neck supple.  Cardiovascular: Normal rate, regular rhythm and normal heart sounds.   Pulmonary/Chest: Effort normal and breath sounds normal. No respiratory distress. She has no wheezes. She has no rales.  Abdominal: Soft. Bowel sounds are normal. She  exhibits no distension and no mass. There is tenderness. There is guarding. There is no rebound.  Musculoskeletal: She exhibits no edema.  Neurological: She is alert and oriented to person, place, and time.  Skin: Skin is warm and dry.  Psychiatric: She has a normal mood and affect. Her behavior is normal.    ED Course  Procedures (including critical care time) Labs Review Labs Reviewed  CBC WITH DIFFERENTIAL - Abnormal; Notable for the following:    WBC 15.5 (*)    Neutro Abs 9.1 (*)    Lymphs Abs 5.0 (*)    All other components within normal limits  URINALYSIS, ROUTINE W REFLEX MICROSCOPIC - Abnormal; Notable for the following:    Specific Gravity, Urine >1.046 (*)    All other components within normal limits  POCT I-STAT, CHEM 8 - Abnormal; Notable for the following:    Glucose, Bld 110 (*)    All other components within normal limits  POCT PREGNANCY, URINE   Imaging Review No results found.  MDM   1. RLQ abdominal pain     Patient with right lower quadrant pain sudden onset this morning. Abdominal exam is significant for right lower quadrant tenderness with some guarding. Patient refused pelvic exam. Patient is concerned for possible problem with her pelvic mass that was placed for her bladder prolapse versus something abnormal with her cesarean section scar. I am concerned for possible acute appendicitis and CT scan of the abdomen and pelvis ordered. She was signed out with PA marrow pending CT scan and urine analysis. If CT is normal, consider ultrasound to rule out ovarian torsion versus ovarian cyst.   Lottie Mussel, PA-C 08/20/13 1703

## 2013-08-19 NOTE — ED Notes (Signed)
Pt presents to the ED with a  Complaint of abdominal pain.  Pt states that she has a red spot at the end of her C-section incision line and it has created a pin point pain.  Pt had a bladder surgery in 2010 in which a transvaginal mess was placed.   Pt states it feels like one of the anchors is pulling

## 2013-08-19 NOTE — ED Notes (Signed)
Pt reporting lower abdominal pain on the incision line of her c-section, transvaginal mesh placed in 2010, no complications since then. This morning began having pin point pain on incision line of c-section.  Pt stated Ibuprofen taken without relief, pt refusing IV at this time.

## 2013-08-20 ENCOUNTER — Emergency Department (HOSPITAL_COMMUNITY): Payer: 59

## 2013-08-20 LAB — POCT I-STAT, CHEM 8
BUN: 10 mg/dL (ref 6–23)
Chloride: 105 mEq/L (ref 96–112)
Creatinine, Ser: 1 mg/dL (ref 0.50–1.10)
Glucose, Bld: 110 mg/dL — ABNORMAL HIGH (ref 70–99)
Potassium: 3.7 mEq/L (ref 3.5–5.1)

## 2013-08-20 LAB — URINALYSIS, ROUTINE W REFLEX MICROSCOPIC
Bilirubin Urine: NEGATIVE
Glucose, UA: NEGATIVE mg/dL
Hgb urine dipstick: NEGATIVE
Specific Gravity, Urine: >1.046 — ABNORMAL HIGH (ref 1.005–1.030)
Urobilinogen, UA: 0.2 mg/dL (ref 0.0–1.0)
pH: 7 (ref 5.0–8.0)

## 2013-08-20 MED ORDER — IOHEXOL 300 MG/ML  SOLN
100.0000 mL | Freq: Once | INTRAMUSCULAR | Status: AC | PRN
  Administered 2013-08-20: 100 mL via INTRAVENOUS

## 2013-08-20 NOTE — ED Notes (Signed)
Pt requesting to be d/c'd at this time.  PA made aware

## 2013-08-20 NOTE — ED Provider Notes (Signed)
Medical screening examination/treatment/procedure(s) were performed by non-physician practitioner and as supervising physician I was immediately available for consultation/collaboration.   Marlisa Caridi, MD 08/20/13 0741 

## 2013-08-20 NOTE — ED Provider Notes (Signed)
Physical Exam  BP 120/85  Pulse 106  Temp(Src) 98.7 F (37.1 C) (Oral)  Resp 36  Ht 5\' 2"  (1.575 m)  Wt 200 lb (90.719 kg)  BMI 36.57 kg/m2  SpO2 98%  LMP 08/05/2013  Physical Exam  Nursing note and vitals reviewed. Constitutional: She is oriented to person, place, and time. She appears well-developed and well-nourished. No distress.  HENT:  Head: Normocephalic and atraumatic.  Right Ear: External ear normal.  Left Ear: External ear normal.  Nose: Nose normal.  Mouth/Throat: Oropharynx is clear and moist.  Eyes: Conjunctivae are normal.  Neck: Normal range of motion.  Cardiovascular: Normal rate, regular rhythm and normal heart sounds.   Pulmonary/Chest: Effort normal and breath sounds normal. No stridor. No respiratory distress. She has no wheezes. She has no rales.  Abdominal: Soft. She exhibits no distension. There is tenderness in the right lower quadrant. There is no rigidity and no guarding.  No periumbilical tenderness  Musculoskeletal: Normal range of motion.  Neurological: She is alert and oriented to person, place, and time. She has normal strength.  Skin: Skin is warm and dry. She is not diaphoretic. No erythema.  Psychiatric: Her behavior is normal.  Tearful.    Labs Reviewed  CBC WITH DIFFERENTIAL - Abnormal; Notable for the following:    WBC 15.5 (*)    Neutro Abs 9.1 (*)    Lymphs Abs 5.0 (*)    All other components within normal limits  URINALYSIS, ROUTINE W REFLEX MICROSCOPIC - Abnormal; Notable for the following:    Specific Gravity, Urine >1.046 (*)    All other components within normal limits  POCT I-STAT, CHEM 8 - Abnormal; Notable for the following:    Glucose, Bld 110 (*)    All other components within normal limits    US Pelvis Complete  08/20/2013   *RADIOLOGY REPORT*  Clinical Data: Pelvic pain, adnexal cyst seen on CT.  TRANSABDOMINAL ULTRASOUND OF PELVIS  Technique:  Transabdominal ultrasound examination of the pelvis was performed  including evaluation of the uterus, ovaries, adnexal regions, and pelvic cul-de-sac.  Comparison:  08/20/2013 CT  Findings:  Uterus:  Normal sonographic appearance, measuring 9.1 x 4.0 x 5.6 cm.  Endometrium: Normal thickness and appearance at 12 mm.  Right ovary: Normal sonographic appearance, measuring 4.0 x 1.6 x 2.7 cm.  Left ovary: Measures 4.7 x 3.1 x 4.9 cm, containing a small hemorrhagic cyst measuring 1.6 x 1.3 cm.  Other Findings:  No free fluid  IMPRESSION: Normal study. No evidence of pelvic mass or other significant abnormality.  DOPPLER ULTRASOUND OF OVARIES  Technique:  Color and duplex Doppler ultrasound was utilized to evaluate blood flow to the ovaries.  Findings: Color Doppler flow with arterial and venous wave forms documented.  IMPRESSION: Color Doppler flow with arterial and venous waveforms documented.   Original Report Authenticated By: Jearld Lesch, M.D.   Ct Abdomen Pelvis W Contrast  08/20/2013   *RADIOLOGY REPORT*  Clinical Data: Right lower quadrant abdominal pain  CT ABDOMEN AND PELVIS WITH CONTRAST  Technique:  Multidetector CT imaging of the abdomen and pelvis was performed following the standard protocol during bolus administration of intravenous contrast.  Contrast: OMNIPAQUE IOHEXOL 300 MG/ML  SOLN, 50mL OMNIPAQUE IOHEXOL 300 MG/ML  SOLN  Comparison: None  Findings:  Limited images through the lung bases demonstrate no significant appreciable abnormality. The heart size is within normal limits. No pleural or pericardial effusion.  There are a couple hypodensities within the liver, nonspecific,  favored to reflect biliary cyst.  Contracted gallbladder.  No biliary ductal dilatation.  Unremarkable spleen, pancreas, adrenal glands.  Symmetric renal enhancement.  No hydroureteronephrosis.  No CT evidence for colitis.  Normal appendix.  Small bowel loops are normal course and caliber.  Small fat containing umbilical hernia.  There is mild associated stranding of the  herniated fat. No free intraperitoneal air or fluid.  There is scattered atherosclerotic calcification of the aorta and its branches. No aneurysmal dilatation.  No lymphadenopathy.  Thin-walled bladder.  Lobular uterine contour may reflect small underlying fibroids.  No adnexal mass.  Corpus luteal cyst on the left.  No acute osseous finding.  IMPRESSION: Normal appendix.  Small fat containing umbilical hernia.  Mild associated stranding of the herniated fat conceivably could reflect incarceration if this corresponds to the area of pain.   Original Report Authenticated By: Jearld Lesch, M.D.   [place] Art/ven Flow Abd Pelv Doppler  08/20/2013   *RADIOLOGY REPORT*  Clinical Data: Pelvic pain, adnexal cyst seen on CT.  TRANSABDOMINAL ULTRASOUND OF PELVIS  Technique:  Transabdominal ultrasound examination of the pelvis was performed including evaluation of the uterus, ovaries, adnexal regions, and pelvic cul-de-sac.  Comparison:  08/20/2013 CT  Findings:  Uterus:  Normal sonographic appearance, measuring 9.1 x 4.0 x 5.6 cm.  Endometrium: Normal thickness and appearance at 12 mm.  Right ovary: Normal sonographic appearance, measuring 4.0 x 1.6 x 2.7 cm.  Left ovary: Measures 4.7 x 3.1 x 4.9 cm, containing a small hemorrhagic cyst measuring 1.6 x 1.3 cm.  Other Findings:  No free fluid  IMPRESSION: Normal study. No evidence of pelvic mass or other significant abnormality.  DOPPLER ULTRASOUND OF OVARIES  Technique:  Color and duplex Doppler ultrasound was utilized to evaluate blood flow to the ovaries.  Findings: Color Doppler flow with arterial and venous wave forms documented.  IMPRESSION: Color Doppler flow with arterial and venous waveforms documented.   Original Report Authenticated By: Jearld Lesch, M.D.    ED Course  Procedures  MDM Patient signed out from [place], [place]. Plan is to obtain CT abd/pelvis. If this is normal, get pelvic US.   No tenderness in or around umbilicus. No concern for  incarcerated hernia. Will get pelvic US. Patient still refused pelvic exam.   Pelvic US shows hemorrhagic cyst on left, normal findings on right. Discussed these results with patient. Will d/c with strict return instructions. Patient is very dehydrated shown on UA. Stressed importance of water rehydration. At this point no concern for appendicitis, bowel obstruction, bowel perforation, cholecystitis, PID, or ectopic pregnancy. Patient again refused pain medications. Stressed importance of follow up with PCP and provider that did bladder mesh surgery. Return instructions given. Vital signs stable for discharge.          Mora Bellman, PA-C 08/20/13 1610  Mora Bellman, PA-C 08/20/13 [phone]

## 2013-08-22 NOTE — ED Provider Notes (Signed)
Medical screening examination/treatment/procedure(s) were performed by non-physician practitioner and as supervising physician I was immediately available for consultation/collaboration.   Dagmar Hait, MD 08/22/13 [phone]

## 2013-09-12 ENCOUNTER — Ambulatory Visit (INDEPENDENT_AMBULATORY_CARE_PROVIDER_SITE_OTHER): Payer: 59 | Admitting: Family Medicine

## 2013-09-12 ENCOUNTER — Encounter: Payer: Self-pay | Admitting: Family Medicine

## 2013-09-12 VITALS — BP 120/72 | HR 118 | Temp 98.4°F | Wt 198.0 lb

## 2013-09-12 DIAGNOSIS — F4311 Post-traumatic stress disorder, acute: Secondary | ICD-10-CM

## 2013-09-12 DIAGNOSIS — F4310 Post-traumatic stress disorder, unspecified: Secondary | ICD-10-CM

## 2013-09-12 MED ORDER — ALPRAZOLAM 0.5 MG PO TABS: 0.5000 mg | ORAL_TABLET | Freq: Three times a day (TID) | ORAL | Status: DC | PRN
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
## 2013-09-13 DIAGNOSIS — F4311 Post-traumatic stress disorder, acute: Secondary | ICD-10-CM | POA: Insufficient documentation

## 2013-10-24 ENCOUNTER — Telehealth: Payer: Self-pay | Admitting: Family Medicine

## 2013-10-24 ENCOUNTER — Ambulatory Visit (INDEPENDENT_AMBULATORY_CARE_PROVIDER_SITE_OTHER): Payer: 59 | Admitting: Family Medicine

## 2013-10-24 ENCOUNTER — Encounter: Payer: Self-pay | Admitting: Family Medicine

## 2013-10-24 VITALS — BP 110/80 | HR 120 | Temp 97.9°F | Wt 199.0 lb

## 2013-10-24 DIAGNOSIS — Z720 Tobacco use: Secondary | ICD-10-CM

## 2013-10-24 DIAGNOSIS — F17200 Nicotine dependence, unspecified, uncomplicated: Secondary | ICD-10-CM

## 2013-10-24 DIAGNOSIS — J45909 Unspecified asthma, uncomplicated: Secondary | ICD-10-CM

## 2013-10-24 MED ORDER — PREDNISONE 20 MG PO TABS: ORAL_TABLET | ORAL | Status: DC

## 2013-10-24 NOTE — Progress Notes (Signed)
  Subjective:    Patient ID: Amy [name], female    DOB: 1976/10/09, 37 y.o.   MRN: [id number]  HPI Lillion is a 37 year old married female smoker who,,,,, one pack a day,,,,, who comes in today accompanied by her husband for evaluation of asthma  She stated for the past couple weeks she's had difficulty with her wheezing but this morning it got really bad. Her husband who is an EMT treated her with a nebulizer treatment x2. This morning she felt a little bit better but at 1:00 she started to feel worse. She feels very breathless.  When attempting to do the history the husband and rapid same he is an EMT and he knows how to treat asthma. I explained that I understood that but I really needed to have some questions of his wife to find out how she was doing. She states in the past she did quit smoking but started again. She states she's tried the Chantix and had good results.   Review of Systems    review of systems otherwise negative Objective:   Physical Exam Well-developed well-nourished female acutely short of breath using her accessory muscles of respiration to breathe. Oral cavity negative neck was supple no adenopathy breath sounds are diminished bilaterally with inspiratory and expiratory wheezing       Assessment & Plan:  Severe asthma..........Marland Kitchen recommended she come in the hospital for IV therapy........... the patient declines and wants to be treated at home by her husband. I again explained that she has severe asthma inspiratory and expiratory wheezing is breathless and should come immediately to the hospital .  Again she declined and wants to be treated at home

## 2013-10-24 NOTE — Telephone Encounter (Signed)
Patient Information:  Caller Name: Naz  Phone: [phone]  Patient: Amy, [name]  Gender: Female  DOB: 07-Aug-1976  Age: 37 Years  PCP: Evelena Peat [facility])  Pregnant: No  Office Follow Up:  Does the office need to follow up with this patient?: Yes  Instructions For The Office: See RN Note below.  Please f/u wtih Delice Bison.  RN Note:  Takeila has been having more asthma episodes over the past 3 weeks.  Woke up at 0500 this morning with runny nose, tongue and eyes itching, coughing, chest tightness, and difficulty breathing.  Husband is a paramedic.  He gave her 3 nebs over the course of 2 hrs.  States heard crackling and wheezing in all fileds.  Diminished sounds on right side.  Currently, having mild shortness of breath with exertion.  Is out of Ventolin Inhaler.  Used all during the past 3 weeks.  No appts in [facility].  Called office and spoke with Tim Lair who advised to send note to office for review by Dr. Tawanna Cooler.  Symptoms  Reason For Call & Symptoms: Asthma  Reviewed Health History In EMR: Yes  Reviewed Medications In EMR: Yes  Reviewed Allergies In EMR: Yes  Reviewed Surgeries / Procedures: Yes  Date of Onset of Symptoms: 10/24/2013  Treatments Tried: Albuterol nebs  Treatments Tried Worked: Yes OB / GYN:  LMP: Unknown  Guideline(s) Used:  Asthma Attack  Disposition Per Guideline:   Go to Office Now  Reason For Disposition Reached:   Moderate asthma attack (e.g., SOB at rest, speaks in phrases, audible wheezes) and not resolved after 2 nebulizer or inhaler treatments given 20 minutes apart  Advice Given:  N/A  Patient Will Follow Care Advice:  YES

## 2013-10-24 NOTE — Telephone Encounter (Signed)
Appt with Dr. Tawanna Cooler at 4:30.

## 2013-10-24 NOTE — Patient Instructions (Addendum)
Drink lots of water  Nebulizer with albuterol every 8 hours  Start the prednisone......... 4 tabs now then 3 tabs every morning  Return tomorrow for followup  No smoking

## 2013-10-25 ENCOUNTER — Ambulatory Visit (INDEPENDENT_AMBULATORY_CARE_PROVIDER_SITE_OTHER): Payer: 59 | Admitting: Internal Medicine

## 2013-10-25 ENCOUNTER — Encounter: Payer: Self-pay | Admitting: Internal Medicine

## 2013-10-25 ENCOUNTER — Ambulatory Visit: Payer: 59 | Admitting: Family

## 2013-10-25 ENCOUNTER — Telehealth: Payer: Self-pay | Admitting: Family Medicine

## 2013-10-25 VITALS — BP 130/80 | HR 138 | Temp 98.1°F | Wt 199.0 lb

## 2013-10-25 DIAGNOSIS — J45909 Unspecified asthma, uncomplicated: Secondary | ICD-10-CM

## 2013-10-25 DIAGNOSIS — F17200 Nicotine dependence, unspecified, uncomplicated: Secondary | ICD-10-CM

## 2013-10-25 DIAGNOSIS — Z720 Tobacco use: Secondary | ICD-10-CM

## 2013-10-25 MED ORDER — LEVALBUTEROL TARTRATE 45 MCG/ACT IN AERO: 1.0000 | INHALATION_SPRAY | RESPIRATORY_TRACT | Status: DC | PRN

## 2013-10-25 MED ORDER — FLUTICASONE-SALMETEROL 250-50 MCG/DOSE IN AEPB: 1.0000 | INHALATION_SPRAY | Freq: Every day | RESPIRATORY_TRACT | Status: DC

## 2013-10-25 MED ORDER — VARENICLINE TARTRATE 1 MG PO TABS: 1.0000 mg | ORAL_TABLET | Freq: Two times a day (BID) | ORAL | Status: DC

## 2013-10-25 MED ORDER — ALBUTEROL SULFATE HFA 108 (90 BASE) MCG/ACT IN AERS: 2.0000 | INHALATION_SPRAY | RESPIRATORY_TRACT | Status: DC | PRN

## 2013-10-25 MED ORDER — VARENICLINE TARTRATE 0.5 MG X 11 & 1 MG X 42 PO MISC: ORAL | Status: DC

## 2013-10-25 NOTE — Telephone Encounter (Signed)
Rx sent to pharmacy.  Okay to send xopenex per Dr Kirtland Bouchard

## 2013-10-25 NOTE — Progress Notes (Signed)
Subjective:    Patient ID: Amy [name], female    DOB: [birth year], 37 y.o.   MRN: [id number]  HPI  37 year old patient  who is seen today for followup of asthma. She was seen yesterday with acute bronchospasm and over the past 24 hours has done quite well. She is on maintenance Advair as well as a prednisone taper. She has required very little additional albuterol. No fever or productive cough. She has not smoked since her office visit yesterday. She has used Chantix successful in the past although did note some mild nausea later in the course. She wishes to try this medication again.  Past Medical History  Diagnosis Date  . Asthma     History   Social History  . Marital Status: Married    Spouse Name: N/A    Number of Children: N/A  . Years of Education: N/A   Occupational History  . Not on file.   Social History Main Topics  . Smoking status: Current Every Day Smoker -- 1.00 packs/day for 7 years    Types: Cigarettes  . Smokeless tobacco: Not on file  . Alcohol Use: No  . Drug Use: Not on file  . Sexual Activity: Not on file   Other Topics Concern  . Not on file   Social History Narrative  . No narrative on file    Past Surgical History  Procedure Laterality Date  . Tubal ligation    . Bladder surgery  2010    Family History  Problem Relation Age of Onset  . Diabetes Paternal Grandfather     Allergies  Allergen Reactions  . Hydromorphone Hcl     REACTION: itiching, swelling No opioids.  . Ketorolac Tromethamine     REACTION: hives, itiching    Current Outpatient Prescriptions on File Prior to Visit  Medication Sig Dispense Refill  . albuterol (PROVENTIL HFA;VENTOLIN HFA) 108 (90 BASE) MCG/ACT inhaler Inhale 2 puffs into the lungs every 4 (four) hours as needed for wheezing or shortness of breath.      . ALPRAZolam (XANAX) 0.5 MG tablet Take 1 tablet (0.5 mg total) by mouth 3 (three) times daily as needed for sleep.  30 tablet  1  . cetirizine (ZYRTEC) 10 MG  tablet Take 10 mg by mouth as needed for allergies.      . diphenhydrAMINE (BENADRYL) 25 MG tablet Take 25 mg by mouth as needed for itching.      . Fluticasone-Salmeterol (ADVAIR) 250-50 MCG/DOSE AEPB Inhale 1 puff into the lungs at bedtime.      Marland Kitchen loratadine (CLARITIN) 10 MG tablet Take 10 mg by mouth daily.      . predniSONE (DELTASONE) 20 MG tablet 4 stat. Then 3 tabs x3 days and taper  50 tablet  0   No current facility-administered medications on file prior to visit.    BP 130/80  Pulse 138  Temp(Src) 98.1 F (36.7 C) (Oral)  Wt 199 lb (90.266 kg)  BMI 36.39 kg/m2  SpO2 97%          Review of Systems  Constitutional: Negative.   HENT: Negative for congestion, dental problem, hearing loss, rhinorrhea, sinus pressure, sore throat and tinnitus.   Eyes: Negative for pain, discharge and visual disturbance.  Respiratory: Positive for shortness of breath and wheezing. Negative for cough.   Cardiovascular: Negative for chest pain, palpitations and leg swelling.  Gastrointestinal: Negative for nausea, vomiting, abdominal pain, diarrhea, constipation, blood in stool and abdominal distention.  Genitourinary: Negative for dysuria, urgency, frequency, hematuria, flank pain, vaginal bleeding, vaginal discharge, difficulty urinating, vaginal pain and pelvic pain.  Musculoskeletal: Negative for arthralgias, gait problem and joint swelling.  Skin: Negative for rash.  Neurological: Negative for dizziness, syncope, speech difficulty, weakness, numbness and headaches.  Hematological: Negative for adenopathy.  Psychiatric/Behavioral: Negative for behavioral problems, dysphoric mood and agitation. The patient is not nervous/anxious.        Objective:   Physical Exam  Constitutional: She is oriented to person, place, and time. She appears well-developed and well-nourished. No distress.  HENT:  Head: Normocephalic.  Right Ear: External ear normal.  Left Ear: External ear normal.   Mouth/Throat: Oropharynx is clear and moist.  Eyes: Conjunctivae and EOM are normal. Pupils are equal, round, and reactive to light.  Neck: Normal range of motion. Neck supple. No thyromegaly present.  Cardiovascular: Normal rate, regular rhythm, normal heart sounds and intact distal pulses.   Pulse 100  Pulmonary/Chest: Effort normal and breath sounds normal. No respiratory distress. She has no wheezes.  O2 saturation 97  Abdominal: Soft. Bowel sounds are normal. She exhibits no mass. There is no tenderness.  Musculoskeletal: Normal range of motion.  Lymphadenopathy:    She has no cervical adenopathy.  Neurological: She is alert and oriented to person, place, and time.  Skin: Skin is warm and dry. No rash noted.  Psychiatric: She has a normal mood and affect. Her behavior is normal.          Assessment & Plan:  Asthma exacerbation. Clinically improved. We'll continue her prednisone taper and maintenance Advair. A new prescription for Chantix dispensed

## 2013-10-25 NOTE — Telephone Encounter (Signed)
ok 

## 2013-10-25 NOTE — Patient Instructions (Signed)
Smoking tobacco is very bad for your health. You should stop smoking immediately.  Call or return to clinic prn if these symptoms worsen or fail to improve as anticipated.

## 2013-10-25 NOTE — Telephone Encounter (Signed)
Pt can not get a refill on albuterol inhaler until 11-01-13. Pt is requesting rescue inhaler. Pt has advair.[facility] [address]

## 2014-01-02 ENCOUNTER — Other Ambulatory Visit: Payer: Self-pay | Admitting: Internal Medicine

## 2014-01-07 ENCOUNTER — Encounter: Payer: Self-pay | Admitting: Family Medicine

## 2014-01-07 ENCOUNTER — Encounter (HOSPITAL_COMMUNITY): Payer: Self-pay | Admitting: Emergency Medicine

## 2014-01-07 ENCOUNTER — Ambulatory Visit (INDEPENDENT_AMBULATORY_CARE_PROVIDER_SITE_OTHER): Payer: 59 | Admitting: Family Medicine

## 2014-01-07 ENCOUNTER — Emergency Department (HOSPITAL_COMMUNITY): Payer: 59

## 2014-01-07 ENCOUNTER — Inpatient Hospital Stay (HOSPITAL_COMMUNITY)
Admission: EM | Admit: 2014-01-07 | Discharge: 2014-01-10 | DRG: 194 | Disposition: A | Discharge status: Home / Self Care | Payer: 59 | Attending: Internal Medicine | Admitting: Internal Medicine

## 2014-01-07 ENCOUNTER — Emergency Department (HOSPITAL_COMMUNITY)
Admission: EM | Admit: 2014-01-07 | Discharge: 2014-01-07 | Disposition: A | Discharge status: Home / Self Care | Payer: 59 | Attending: Emergency Medicine | Admitting: Emergency Medicine

## 2014-01-07 VITALS — BP 98/70 | Temp 99.7°F | Wt 201.0 lb

## 2014-01-07 DIAGNOSIS — J45901 Unspecified asthma with (acute) exacerbation: Secondary | ICD-10-CM | POA: Diagnosis present

## 2014-01-07 DIAGNOSIS — Z720 Tobacco use: Secondary | ICD-10-CM

## 2014-01-07 DIAGNOSIS — F3289 Other specified depressive episodes: Secondary | ICD-10-CM

## 2014-01-07 DIAGNOSIS — F17200 Nicotine dependence, unspecified, uncomplicated: Secondary | ICD-10-CM | POA: Diagnosis present

## 2014-01-07 DIAGNOSIS — R509 Fever, unspecified: Secondary | ICD-10-CM | POA: Insufficient documentation

## 2014-01-07 DIAGNOSIS — IMO0001 Reserved for inherently not codable concepts without codable children: Secondary | ICD-10-CM | POA: Insufficient documentation

## 2014-01-07 DIAGNOSIS — R0789 Other chest pain: Secondary | ICD-10-CM

## 2014-01-07 DIAGNOSIS — F329 Major depressive disorder, single episode, unspecified: Secondary | ICD-10-CM | POA: Diagnosis present

## 2014-01-07 DIAGNOSIS — R6889 Other general symptoms and signs: Secondary | ICD-10-CM

## 2014-01-07 DIAGNOSIS — Z833 Family history of diabetes mellitus: Secondary | ICD-10-CM

## 2014-01-07 DIAGNOSIS — F339 Major depressive disorder, recurrent, unspecified: Secondary | ICD-10-CM | POA: Diagnosis present

## 2014-01-07 DIAGNOSIS — J111 Influenza due to unidentified influenza virus with other respiratory manifestations: Secondary | ICD-10-CM

## 2014-01-07 DIAGNOSIS — IMO0002 Reserved for concepts with insufficient information to code with codable children: Secondary | ICD-10-CM | POA: Insufficient documentation

## 2014-01-07 DIAGNOSIS — J45909 Unspecified asthma, uncomplicated: Secondary | ICD-10-CM

## 2014-01-07 DIAGNOSIS — F411 Generalized anxiety disorder: Secondary | ICD-10-CM | POA: Insufficient documentation

## 2014-01-07 DIAGNOSIS — J029 Acute pharyngitis, unspecified: Secondary | ICD-10-CM | POA: Insufficient documentation

## 2014-01-07 DIAGNOSIS — J1100 Influenza due to unidentified influenza virus with unspecified type of pneumonia: Principal | ICD-10-CM | POA: Diagnosis present

## 2014-01-07 DIAGNOSIS — Z79899 Other long term (current) drug therapy: Secondary | ICD-10-CM

## 2014-01-07 DIAGNOSIS — I498 Other specified cardiac arrhythmias: Secondary | ICD-10-CM | POA: Diagnosis present

## 2014-01-07 DIAGNOSIS — R112 Nausea with vomiting, unspecified: Secondary | ICD-10-CM | POA: Insufficient documentation

## 2014-01-07 DIAGNOSIS — Z23 Encounter for immunization: Secondary | ICD-10-CM

## 2014-01-07 DIAGNOSIS — R630 Anorexia: Secondary | ICD-10-CM | POA: Insufficient documentation

## 2014-01-07 DIAGNOSIS — J189 Pneumonia, unspecified organism: Secondary | ICD-10-CM

## 2014-01-07 DIAGNOSIS — J40 Bronchitis, not specified as acute or chronic: Secondary | ICD-10-CM

## 2014-01-07 DIAGNOSIS — J3489 Other specified disorders of nose and nasal sinuses: Secondary | ICD-10-CM | POA: Insufficient documentation

## 2014-01-07 DIAGNOSIS — J09X2 Influenza due to identified novel influenza A virus with other respiratory manifestations: Secondary | ICD-10-CM

## 2014-01-07 DIAGNOSIS — R000 Tachycardia, unspecified: Secondary | ICD-10-CM | POA: Insufficient documentation

## 2014-01-07 HISTORY — DX: Anxiety disorder, unspecified: F41.9

## 2014-01-07 LAB — CBC
HCT: 40.5 % (ref 36.0–46.0)
Hemoglobin: 13.9 g/dL (ref 12.0–15.0)
MCH: 29.8 pg (ref 26.0–34.0)
MCHC: 34.3 g/dL (ref 30.0–36.0)
MCV: 86.9 fL (ref 78.0–100.0)
PLATELETS: 259 10*3/uL (ref 150–400)
RBC: 4.66 MIL/uL (ref 3.87–5.11)
RDW: 13.1 % (ref 11.5–15.5)
WBC: 12.6 10*3/uL — ABNORMAL HIGH (ref 4.0–10.5)

## 2014-01-07 LAB — BASIC METABOLIC PANEL
BUN: 7 mg/dL (ref 6–23)
CALCIUM: 8.6 mg/dL (ref 8.4–10.5)
CO2: 18 meq/L — AB (ref 19–32)
CREATININE: 0.81 mg/dL (ref 0.50–1.10)
Chloride: 102 mEq/L (ref 96–112)
GFR calc Af Amer: >90 mL/min (ref >90)
Glucose, Bld: 135 mg/dL — ABNORMAL HIGH (ref 70–99)
Potassium: 3.8 mEq/L (ref 3.7–5.3)
SODIUM: 135 meq/L — AB (ref 137–147)

## 2014-01-07 LAB — URINALYSIS, ROUTINE W REFLEX MICROSCOPIC
Bilirubin Urine: NEGATIVE
GLUCOSE, UA: NEGATIVE mg/dL
Hgb urine dipstick: NEGATIVE
KETONES UR: NEGATIVE mg/dL
LEUKOCYTES UA: NEGATIVE
Nitrite: NEGATIVE
PROTEIN: NEGATIVE mg/dL
Specific Gravity, Urine: 1.025 (ref 1.005–1.030)
UROBILINOGEN UA: 0.2 mg/dL (ref 0.0–1.0)
pH: 5.5 (ref 5.0–8.0)

## 2014-01-07 LAB — RAPID STREP SCREEN (MED CTR MEBANE ONLY): Streptococcus, Group A Screen (Direct): NEGATIVE

## 2014-01-07 LAB — URINE MICROSCOPIC-ADD ON

## 2014-01-07 LAB — POCT INFLUENZA A/B
INFLUENZA A, POC: POSITIVE
Influenza B, POC: POSITIVE

## 2014-01-07 MED ORDER — SODIUM CHLORIDE 0.9 % IV SOLN
INTRAVENOUS | Status: DC
  Administered 2014-01-08: via INTRAVENOUS

## 2014-01-07 MED ORDER — ALBUTEROL (5 MG/ML) CONTINUOUS INHALATION SOLN
10.0000 mg/h | INHALATION_SOLUTION | Freq: Once | RESPIRATORY_TRACT | Status: AC
  Administered 2014-01-08: 10 mg/h via RESPIRATORY_TRACT
  Filled 2014-01-07: qty 20

## 2014-01-07 MED ORDER — AZITHROMYCIN 250 MG PO TABS: ORAL_TABLET | ORAL | Status: DC

## 2014-01-07 MED ORDER — OSELTAMIVIR PHOSPHATE 75 MG PO CAPS
75.0000 mg | ORAL_CAPSULE | Freq: Two times a day (BID) | ORAL | Status: DC
Start: 2014-01-07 — End: 2014-01-10

## 2014-01-07 MED ORDER — LORAZEPAM 2 MG/ML IJ SOLN
1.0000 mg | Freq: Once | INTRAMUSCULAR | Status: AC
  Administered 2014-01-08: 1 mg via INTRAVENOUS
  Filled 2014-01-07: qty 1

## 2014-01-07 MED ORDER — IPRATROPIUM-ALBUTEROL 0.5-2.5 (3) MG/3ML IN SOLN
3.0000 mL | Freq: Once | RESPIRATORY_TRACT | Status: AC
  Administered 2014-01-07: 3 mL via RESPIRATORY_TRACT

## 2014-01-07 MED ORDER — LEVALBUTEROL HCL 1.25 MG/3ML IN NEBU: 1.2500 mg | INHALATION_SOLUTION | RESPIRATORY_TRACT | Status: DC | PRN

## 2014-01-07 MED ORDER — METHYLPREDNISOLONE SODIUM SUCC 125 MG IJ SOLR
125.0000 mg | Freq: Once | INTRAMUSCULAR | Status: AC
  Administered 2014-01-08: 125 mg via INTRAVENOUS
  Filled 2014-01-07: qty 2

## 2014-01-07 MED ORDER — IPRATROPIUM BROMIDE 0.02 % IN SOLN
0.5000 mg | Freq: Once | RESPIRATORY_TRACT | Status: AC
  Administered 2014-01-07: 0.5 mg via RESPIRATORY_TRACT
  Filled 2014-01-07: qty 2.5

## 2014-01-07 MED ORDER — ONDANSETRON HCL 4 MG/2ML IJ SOLN
4.0000 mg | Freq: Once | INTRAMUSCULAR | Status: AC
  Administered 2014-01-07: 4 mg via INTRAVENOUS
  Filled 2014-01-07: qty 2

## 2014-01-07 MED ORDER — SODIUM CHLORIDE 0.9 % IV BOLUS (SEPSIS)
1000.0000 mL | Freq: Once | INTRAVENOUS | Status: AC
  Administered 2014-01-07: 1000 mL via INTRAVENOUS

## 2014-01-07 MED ORDER — LORAZEPAM 2 MG/ML IJ SOLN
1.0000 mg | Freq: Once | INTRAMUSCULAR | Status: AC
  Administered 2014-01-07: 1 mg via INTRAVENOUS
  Filled 2014-01-07: qty 1

## 2014-01-07 MED ORDER — ALBUTEROL SULFATE (2.5 MG/3ML) 0.083% IN NEBU
5.0000 mg | INHALATION_SOLUTION | Freq: Once | RESPIRATORY_TRACT | Status: AC
  Administered 2014-01-07: 5 mg via RESPIRATORY_TRACT
  Filled 2014-01-07: qty 6

## 2014-01-07 MED ORDER — LEVALBUTEROL HCL 0.63 MG/3ML IN NEBU: 0.6300 mg | INHALATION_SOLUTION | Freq: Once | RESPIRATORY_TRACT | Status: DC

## 2014-01-07 NOTE — Progress Notes (Signed)
Chief Complaint  Patient presents with  . Cough    congestion, headache, body aches, chills, fatigue, fever since last night     HPI:  -started: last night -symptoms: per above and SOB and wheezing -denies:fever, SOB, NVD, tooth pain -has tried: albuterol and advair -sick contacts/travel/risks: denies flu exposure, tick exposure or or Ebola risks -Hx of: allergies and ashtma  ROS: See pertinent positives and negatives per HPI.  Past Medical History  Diagnosis Date  . Asthma     Past Surgical History  Procedure Laterality Date  . Tubal ligation    . Bladder surgery  2010    Family History  Problem Relation Age of Onset  . Diabetes Paternal Grandfather     History   Social History  . Marital Status: Married    Spouse Name: N/A    Number of Children: N/A  . Years of Education: N/A   Social History Main Topics  . Smoking status: Current Every Day Smoker -- 1.00 packs/day for 7 years    Types: Cigarettes  . Smokeless tobacco: None  . Alcohol Use: No  . Drug Use: None  . Sexual Activity: None   Other Topics Concern  . None   Social History Narrative  . None    Current outpatient prescriptions:ADVAIR DISKUS 250-50 MCG/DOSE AEPB, INHALE 1 PUFF INTO THE LUNGS AT BEDTIME, Disp: 60 each, Rfl: 2;  albuterol (PROVENTIL HFA;VENTOLIN HFA) 108 (90 BASE) MCG/ACT inhaler, Inhale 2 puffs into the lungs every 4 (four) hours as needed for wheezing or shortness of breath., Disp: 18 g, Rfl: 3 ALPRAZolam (XANAX) 0.5 MG tablet, Take 1 tablet (0.5 mg total) by mouth 3 (three) times daily as needed for sleep., Disp: 30 tablet, Rfl: 1;  cetirizine (ZYRTEC) 10 MG tablet, Take 10 mg by mouth as needed for allergies., Disp: , Rfl: ;  diphenhydrAMINE (BENADRYL) 25 MG tablet, Take 25 mg by mouth as needed for itching., Disp: , Rfl:  levalbuterol (XOPENEX HFA) 45 MCG/ACT inhaler, Inhale 1-2 puffs into the lungs every 4 (four) hours as needed for wheezing., Disp: 1 Inhaler, Rfl: 3;  loratadine  (CLARITIN) 10 MG tablet, Take 10 mg by mouth daily., Disp: , Rfl: ;  predniSONE (DELTASONE) 20 MG tablet, 4 stat. Then 3 tabs x3 days and taper, Disp: 50 tablet, Rfl: 0 varenicline (CHANTIX CONTINUING MONTH PAK) 1 MG tablet, Take 1 tablet (1 mg total) by mouth 2 (two) times daily., Disp: 60 tablet, Rfl: 2;  varenicline (CHANTIX STARTING MONTH PAK) 0.5 MG X 11 & 1 MG X 42 tablet, Take one 0.5 mg tablet by mouth once daily for 3 days, then increase to one 0.5 mg tablet twice daily for 4 days, then increase to one 1 mg tablet twice daily., Disp: 53 tablet, Rfl: 0  EXAM:  Filed Vitals:   01/07/14 0855  BP: 98/70  Temp: 99.7 F (37.6 C)    Body mass index is 36.75 kg/(m^2).  GENERAL: vitals reviewed and listed above, alert, oriented, appears well hydrated and in no acute distress  HEENT: atraumatic, conjunttiva clear, no obvious abnormalities on inspection of external nose and ears, normal appearance of ear canals and TMs, clear nasal congestion, mild post oropharyngeal erythema with PND, no tonsillar edema or exudate, no sinus TTP  NECK: no obvious masses on inspection  LUNGS: difuse wheezing mild, leaning over exam table, mildly increased rr and looks uncomfortable  CV: HRRR, no peripheral edema  MS: moves all extremities without noticeable abnormality  PSYCH: pleasant and  cooperative, no obvious depression or anxiety  ASSESSMENT AND PLAN:  Discussed the following assessment and plan:  Influenza with respiratory manifestations  -hx of asthma, flu test +, O2 sats 96% but pt not able to sit down as reports she "can't breath" and is leaning over the exam table -advised given she reports SOB and is uncomfortbale on initial exam with mild resp distress that she go to hospital -she preferred to try breathing tx first which we did, but she was no better - still leaning over exam table in distress and advised she must go to hospital for further treatment and offered ems but she reports  husband is paramedic and will go with him -she will need tamiflu, abx, possibly steriods and monitoring until no longer in distress -staff to contact ED to notify them of pt arrival    There are no Patient Instructions on file for this visit.   Kriste Basque R.

## 2014-01-07 NOTE — ED Provider Notes (Signed)
CSN: [id number]     Arrival date & time 01/07/14  4098 History   First MD Initiated Contact with Patient 01/07/14 1001     Chief Complaint  Patient presents with  . flu like symptoms   . Asthma   (Consider location/radiation/quality/duration/timing/severity/associated sxs/prior Treatment) HPI Comments: Pt is a 38 y/o female with a PMHx of asthma and anxiety who presents to the ED from her PCP office complaining of flu-like symptoms and asthma exacerbation beginning yesterday. Pt admits to subjective fever, cough, wheezing, nausea, vomiting, body aches and shortness of breath. She was given 2.5 mg albuterol neb at PCP office with minimal relief. She has been trying to use inhalers at home without relief. Cough productive with mucus. Both of her parents are sick with similar symptoms. She did not have the flu vaccine. States she is anxious which exacerbates her asthma.  Patient is a 38 y.o. female presenting with asthma. The history is provided by the patient and the spouse.  Asthma Associated symptoms include arthralgias, chills, congestion, coughing, a fever, myalgias, nausea, a sore throat and vomiting.    Past Medical History  Diagnosis Date  . Asthma    Past Surgical History  Procedure Laterality Date  . Tubal ligation    . Bladder surgery  2010   Family History  Problem Relation Age of Onset  . Diabetes Paternal Grandfather    History  Substance Use Topics  . Smoking status: Current Every Day Smoker -- 1.00 packs/day for 7 years    Types: Cigarettes  . Smokeless tobacco: Not on file  . Alcohol Use: No   OB History   Grav Para Term Preterm Abortions TAB SAB Ect Mult Living                 Review of Systems  Constitutional: Positive for fever, chills and appetite change.  HENT: Positive for congestion and sore throat.   Respiratory: Positive for cough, shortness of breath and wheezing.   Gastrointestinal: Positive for nausea and vomiting.  Musculoskeletal: Positive for  arthralgias and myalgias.  All other systems reviewed and are negative.    Allergies  Hydromorphone hcl and Ketorolac tromethamine  Home Medications   Current Outpatient Rx  Name  Route  Sig  Dispense  Refill  . ADVAIR DISKUS 250-50 MCG/DOSE AEPB      INHALE 1 PUFF INTO THE LUNGS AT BEDTIME   60 each   2   . albuterol (PROVENTIL HFA;VENTOLIN HFA) 108 (90 BASE) MCG/ACT inhaler   Inhalation   Inhale 2 puffs into the lungs every 4 (four) hours as needed for wheezing or shortness of breath.   18 g   3   . ALPRAZolam (XANAX) 0.5 MG tablet   Oral   Take 1 tablet (0.5 mg total) by mouth 3 (three) times daily as needed for sleep.   30 tablet   1   . diphenhydrAMINE (BENADRYL) 25 MG tablet   Oral   Take 25 mg by mouth as needed for itching.         . levalbuterol (XOPENEX HFA) 45 MCG/ACT inhaler   Inhalation   Inhale 1-2 puffs into the lungs every 4 (four) hours as needed for wheezing.   1 Inhaler   3   . varenicline (CHANTIX CONTINUING MONTH PAK) 1 MG tablet   Oral   Take 1 tablet (1 mg total) by mouth 2 (two) times daily.   60 tablet   2   . azithromycin (ZITHROMAX Z-PAK) 250  MG tablet      2 po day one, then 1 daily x 4 days   5 tablet   0   . levalbuterol (XOPENEX) 0.63 MG/3ML nebulizer solution   Nebulization   Take 3 mLs (0.63 mg total) by nebulization once.   3 mL   12   . oseltamivir (TAMIFLU) 75 MG capsule   Oral   Take 1 capsule (75 mg total) by mouth every 12 (twelve) hours.   10 capsule   0    BP 114/57  Pulse 135  Temp(Src) 99.4 F (37.4 C) (Oral)  Resp 19  SpO2 98%  LMP 12/24/2013 Physical Exam  Nursing note and vitals reviewed. Constitutional: She is oriented to person, place, and time. She appears well-developed and well-nourished. No distress.  HENT:  Head: Normocephalic and atraumatic.  Nose: Mucosal edema present.  Mouth/Throat: Posterior oropharyngeal edema and posterior oropharyngeal erythema present. No oropharyngeal  exudate.  Post nasal drip.  Eyes: Conjunctivae are normal. No scleral icterus.  Neck: Normal range of motion. Neck supple.  Cardiovascular: Regular rhythm, normal heart sounds and intact distal pulses.  Tachycardia present.   Pulmonary/Chest: Breath sounds normal. Tachypnea noted. She has no wheezes.  Scattered expiratory ronchi cleared with cough.  Abdominal: Soft. Bowel sounds are normal. There is no tenderness.  Musculoskeletal: Normal range of motion. She exhibits no edema.  Neurological: She is alert and oriented to person, place, and time.  Skin: Skin is warm and dry. She is not diaphoretic.  Psychiatric: Her behavior is normal. Her mood appears anxious.    ED Course  Procedures (including critical care time) Labs Review Labs Reviewed  CBC - Abnormal; Notable for the following:    WBC 12.6 (*)    All other components within normal limits  BASIC METABOLIC PANEL - Abnormal; Notable for the following:    Sodium 135 (*)    CO2 18 (*)    Glucose, Bld 135 (*)    All other components within normal limits  URINALYSIS, ROUTINE W REFLEX MICROSCOPIC - Abnormal; Notable for the following:    APPearance TURBID (*)    All other components within normal limits  URINE MICROSCOPIC-ADD ON - Abnormal; Notable for the following:    Squamous Epithelial / LPF MANY (*)    All other components within normal limits  RAPID STREP SCREEN  CULTURE, GROUP A STREP   Imaging Review Dg Chest 2 View  01/07/2014   CLINICAL DATA:  Asthma  EXAM: CHEST  2 VIEW  COMPARISON:  01/26/2012  FINDINGS: Prominent lung markings in the bases. This was not present previously could be due to acute infection. No lobar infiltrate or effusion. Lung volume normal.  IMPRESSION: Increased lung markings bilaterally which could be due to acute infection such is bronchitis. Fluid overload also could have this appearance.   Electronically Signed   By: Marlan Palau M.D.   On: 01/07/2014 11:36    EKG Interpretation   None        MDM   1. Flu-like symptoms   2. Bronchitis     Pt presenting with flu-like symptoms x 1 day and asthma exacerbation. She is anxious but in NAD. Tachycardic, tachypneic, O2 sat 92% on RA. Wet sounding cough present. Febrile at 99.7. Scattered ronchi cleared with cough present on exam. Labs and CXR pending- cbc, bmp, rapid strep. Plan to give IV fluids, zofran, ativan, DuoNeb. 11:55 AM Pt reports improvement of her symptoms after DuoNeb. On repeat exam, breath sounds improved. HR  decreased to ~130 from 153, BP 114/57. O2 sat improved to 98% on RA. CXR concerning for bronchitis, given associated asthma will tx with azithromycin. Also since flu-like symptoms beginning < 48 hours ago, will start Tamiflu. Tolerating PO. She is stable for discharge. F/u with PCP. Return precautions given. Patient states understanding of treatment care plan and is agreeable.   Trevor MaceRobyn M Albert, PA-C 01/07/14 1157

## 2014-01-07 NOTE — Progress Notes (Signed)
Pre visit review using our clinic review tool, if applicable. No additional management support is needed unless otherwise documented below in the visit note. 

## 2014-01-07 NOTE — ED Notes (Signed)
Pt dx with flu earlier. Asthma with wheezing. Taking Xopenex every 45 mins. Without relief. Hx of anxiety.

## 2014-01-07 NOTE — Discharge Instructions (Signed)
Take antibiotic to completion for your bronchitis. Use Xopenex every 4 hours as needed. Take Tamiflu twice daily for the next 5 days. Rest, stay well hydrated. Follow up with your primary care doctor.  Acute Bronchitis Bronchitis is inflammation of the airways that extend from the windpipe into the lungs (bronchi). The inflammation often causes mucus to develop. This leads to a cough, which is the most common symptom of bronchitis.  In acute bronchitis, the condition usually develops suddenly and goes away over time, usually in a couple weeks. Smoking, allergies, and asthma can make bronchitis worse. Repeated episodes of bronchitis may cause further lung problems.  CAUSES Acute bronchitis is most often caused by the same virus that causes a cold. The virus can spread from person to person (contagious).  SIGNS AND SYMPTOMS   Cough.   Fever.   Coughing up mucus.   Body aches.   Chest congestion.   Chills.   Shortness of breath.   Sore throat.  DIAGNOSIS  Acute bronchitis is usually diagnosed through a physical exam. Tests, such as chest X-rays, are sometimes done to rule out other conditions.  TREATMENT  Acute bronchitis usually goes away in a couple weeks. Often times, no medical treatment is necessary. Medicines are sometimes given for relief of fever or cough. Antibiotics are usually not needed but may be prescribed in certain situations. In some cases, an inhaler may be recommended to help reduce shortness of breath and control the cough. A cool mist vaporizer may also be used to help thin bronchial secretions and make it easier to clear the chest.  HOME CARE INSTRUCTIONS  Get plenty of rest.   Drink enough fluids to keep your urine clear or pale yellow (unless you have a medical condition that requires fluid restriction). Increasing fluids may help thin your secretions and will prevent dehydration.   Only take over-the-counter or prescription medicines as directed by  your health care provider.   Avoid smoking and secondhand smoke. Exposure to cigarette smoke or irritating chemicals will make bronchitis worse. If you are a smoker, consider using nicotine gum or skin patches to help control withdrawal symptoms. Quitting smoking will help your lungs heal faster.   Reduce the chances of another bout of acute bronchitis by washing your hands frequently, avoiding people with cold symptoms, and trying not to touch your hands to your mouth, nose, or eyes.   Follow up with your health care provider as directed.  SEEK MEDICAL CARE IF: Your symptoms do not improve after 1 week of treatment.  SEEK IMMEDIATE MEDICAL CARE IF:  You develop an increased fever or chills.   You have chest pain.   You have severe shortness of breath.  You have bloody sputum.   You develop dehydration.  You develop fainting.  You develop repeated vomiting.  You develop a severe headache. MAKE SURE YOU:   Understand these instructions.  Will watch your condition.  Will get help right away if you are not doing well or get worse. Document Released: 01/19/2005 Document Revised: 08/14/2013 Document Reviewed: 06/04/2013 [facility] Patient Information 2014 [place], [place].  Influenza, Adult Influenza ("the flu") is a viral infection of the respiratory tract. It occurs more often in winter months because people spend more time in close contact with one another. Influenza can make you feel very sick. Influenza easily spreads from person to person (contagious). CAUSES  Influenza is caused by a virus that infects the respiratory tract. You can catch the virus by breathing in droplets  from an infected person's cough or sneeze. You can also catch the virus by touching something that was recently contaminated with the virus and then touching your mouth, nose, or eyes. SYMPTOMS  Symptoms typically last 4 to 10 days and may include:  Fever.  Chills.  Headache, body aches, and  muscle aches.  Sore throat.  Chest discomfort and cough.  Poor appetite.  Weakness or feeling tired.  Dizziness.  Nausea or vomiting. DIAGNOSIS  Diagnosis of influenza is often made based on your history and a physical exam. A nose or throat swab test can be done to confirm the diagnosis. RISKS AND COMPLICATIONS You may be at risk for a more severe case of influenza if you smoke cigarettes, have diabetes, have chronic heart disease (such as heart failure) or lung disease (such as asthma), or if you have a weakened immune system. Elderly people and pregnant women are also at risk for more serious infections. The most common complication of influenza is a lung infection (pneumonia). Sometimes, this complication can require emergency medical care and may be life-threatening. PREVENTION  An annual influenza vaccination (flu shot) is the best way to avoid getting influenza. An annual flu shot is now routinely recommended for all adults in the [place] TREATMENT  In mild cases, influenza goes away on its own. Treatment is directed at relieving symptoms. For more severe cases, your caregiver may prescribe antiviral medicines to shorten the sickness. Antibiotic medicines are not effective, because the infection is caused by a virus, not by bacteria. HOME CARE INSTRUCTIONS  Only take over-the-counter or prescription medicines for pain, discomfort, or fever as directed by your caregiver.  Use a cool mist humidifier to make breathing easier.  Get plenty of rest until your temperature returns to normal. This usually takes 3 to 4 days.  Drink enough fluids to keep your urine clear or pale yellow.  Cover your mouth and nose when coughing or sneezing, and wash your hands well to avoid spreading the virus.  Stay home from work or school until your fever has been gone for at least 1 full day. SEEK MEDICAL CARE IF:   You have chest pain or a deep cough that worsens or produces more mucus.  You have  nausea, vomiting, or diarrhea. SEEK IMMEDIATE MEDICAL CARE IF:   You have difficulty breathing, shortness of breath, or your skin or nails turn bluish.  You have severe neck pain or stiffness.  You have a severe headache, facial pain, or earache.  You have a worsening or recurring fever.  You have nausea or vomiting that cannot be controlled. MAKE SURE YOU:  Understand these instructions.  Will watch your condition.  Will get help right away if you are not doing well or get worse. Document Released: 12/09/2000 Document Revised: 06/12/2012 Document Reviewed: 03/12/2012 [facility] Patient Information 2014 [place], [place].

## 2014-01-07 NOTE — ED Notes (Signed)
Pt was sent from PCP for exacerbation of asthma and flu like symptoms x 2 days. Pt endorses fever,nausea and vomiting as well as shortness of breath. Pt was given 2.5 mg albuterol breathing treatment at dr's office, sts no relief.

## 2014-01-07 NOTE — Addendum Note (Signed)
Addended by: Azucena FreedMILLNER, Antonie Borjon C on: 01/07/2014 10:13 AM   Modules accepted: Orders

## 2014-01-08 ENCOUNTER — Encounter (HOSPITAL_COMMUNITY): Payer: Self-pay | Admitting: Emergency Medicine

## 2014-01-08 DIAGNOSIS — R0789 Other chest pain: Secondary | ICD-10-CM | POA: Diagnosis present

## 2014-01-08 DIAGNOSIS — F329 Major depressive disorder, single episode, unspecified: Secondary | ICD-10-CM

## 2014-01-08 DIAGNOSIS — J09X2 Influenza due to identified novel influenza A virus with other respiratory manifestations: Secondary | ICD-10-CM | POA: Diagnosis present

## 2014-01-08 DIAGNOSIS — J45901 Unspecified asthma with (acute) exacerbation: Secondary | ICD-10-CM

## 2014-01-08 DIAGNOSIS — J189 Pneumonia, unspecified organism: Secondary | ICD-10-CM

## 2014-01-08 DIAGNOSIS — F17200 Nicotine dependence, unspecified, uncomplicated: Secondary | ICD-10-CM

## 2014-01-08 DIAGNOSIS — F411 Generalized anxiety disorder: Secondary | ICD-10-CM | POA: Diagnosis present

## 2014-01-08 DIAGNOSIS — F3289 Other specified depressive episodes: Secondary | ICD-10-CM

## 2014-01-08 LAB — CBC
HEMATOCRIT: 37.5 % (ref 36.0–46.0)
Hemoglobin: 12.9 g/dL (ref 12.0–15.0)
MCH: 30.1 pg (ref 26.0–34.0)
MCHC: 34.4 g/dL (ref 30.0–36.0)
MCV: 87.6 fL (ref 78.0–100.0)
Platelets: 256 10*3/uL (ref 150–400)
RBC: 4.28 MIL/uL (ref 3.87–5.11)
RDW: 13.5 % (ref 11.5–15.5)
WBC: 9.8 10*3/uL (ref 4.0–10.5)

## 2014-01-08 LAB — PROTIME-INR
INR: 1.09 (ref 0.00–1.49)
PROTHROMBIN TIME: 13.9 s (ref 11.6–15.2)

## 2014-01-08 LAB — COMPREHENSIVE METABOLIC PANEL
ALK PHOS: 53 U/L (ref 39–117)
ALT: 8 U/L (ref 0–35)
AST: 15 U/L (ref 0–37)
Albumin: 3 g/dL — ABNORMAL LOW (ref 3.5–5.2)
BUN: 5 mg/dL — ABNORMAL LOW (ref 6–23)
CO2: 18 mEq/L — ABNORMAL LOW (ref 19–32)
Calcium: 8 mg/dL — ABNORMAL LOW (ref 8.4–10.5)
Chloride: 106 mEq/L (ref 96–112)
Creatinine, Ser: 0.65 mg/dL (ref 0.50–1.10)
Glucose, Bld: 212 mg/dL — ABNORMAL HIGH (ref 70–99)
POTASSIUM: 3.7 meq/L (ref 3.7–5.3)
SODIUM: 137 meq/L (ref 137–147)
Total Bilirubin: <0.2 mg/dL — ABNORMAL LOW (ref 0.3–1.2)
Total Protein: 6.3 g/dL (ref 6.0–8.3)

## 2014-01-08 MED ORDER — ONDANSETRON HCL 4 MG/2ML IJ SOLN: 4.0000 mg | Freq: Four times a day (QID) | INTRAMUSCULAR | Status: DC | PRN

## 2014-01-08 MED ORDER — CHLORDIAZEPOXIDE HCL 5 MG PO CAPS
5.0000 mg | ORAL_CAPSULE | Freq: Two times a day (BID) | ORAL | Status: DC
  Administered 2014-01-08 – 2014-01-09 (×4): 5 mg via ORAL
  Filled 2014-01-08 (×5): qty 1

## 2014-01-08 MED ORDER — PNEUMOCOCCAL VAC POLYVALENT 25 MCG/0.5ML IJ INJ
0.5000 mL | INJECTION | INTRAMUSCULAR | Status: DC | PRN
  Filled 2014-01-08: qty 0.5

## 2014-01-08 MED ORDER — ONDANSETRON HCL 4 MG PO TABS: 4.0000 mg | ORAL_TABLET | Freq: Four times a day (QID) | ORAL | Status: DC | PRN

## 2014-01-08 MED ORDER — METHYLPREDNISOLONE SODIUM SUCC 125 MG IJ SOLR
60.0000 mg | Freq: Four times a day (QID) | INTRAMUSCULAR | Status: DC
  Administered 2014-01-08 (×2): 60 mg via INTRAVENOUS
  Filled 2014-01-08 (×6): qty 0.96

## 2014-01-08 MED ORDER — DEXTROSE 5 % IV SOLN
1.0000 g | INTRAVENOUS | Status: DC
  Administered 2014-01-08 – 2014-01-10 (×3): 1 g via INTRAVENOUS
  Filled 2014-01-08 (×3): qty 10

## 2014-01-08 MED ORDER — METHYLPREDNISOLONE SODIUM SUCC 125 MG IJ SOLR
60.0000 mg | INTRAMUSCULAR | Status: DC
  Administered 2014-01-09 – 2014-01-10 (×2): 60 mg via INTRAVENOUS
  Filled 2014-01-08 (×3): qty 0.96

## 2014-01-08 MED ORDER — INFLUENZA VAC SPLIT QUAD 0.5 ML IM SUSP
0.5000 mL | INTRAMUSCULAR | Status: AC
  Administered 2014-01-09: 09:00:00 0.5 mL via INTRAMUSCULAR
  Filled 2014-01-08 (×2): qty 0.5

## 2014-01-08 MED ORDER — GUAIFENESIN ER 600 MG PO TB12
600.0000 mg | ORAL_TABLET | Freq: Two times a day (BID) | ORAL | Status: DC
  Administered 2014-01-08 (×2): 600 mg via ORAL
  Filled 2014-01-08 (×3): qty 1

## 2014-01-08 MED ORDER — IPRATROPIUM-ALBUTEROL 0.5-2.5 (3) MG/3ML IN SOLN
3.0000 mL | Freq: Four times a day (QID) | RESPIRATORY_TRACT | Status: DC
  Administered 2014-01-08 (×2): 3 mL via RESPIRATORY_TRACT
  Filled 2014-01-08: qty 3

## 2014-01-08 MED ORDER — SODIUM CHLORIDE 0.9 % IV SOLN
INTRAVENOUS | Status: DC
Start: 2014-01-08 — End: 2014-01-10
  Administered 2014-01-08 – 2014-01-09 (×2): via INTRAVENOUS

## 2014-01-08 MED ORDER — ALBUTEROL SULFATE (2.5 MG/3ML) 0.083% IN NEBU
2.5000 mg | INHALATION_SOLUTION | RESPIRATORY_TRACT | Status: DC | PRN
  Administered 2014-01-08: 2.5 mg via RESPIRATORY_TRACT
  Filled 2014-01-08: qty 3

## 2014-01-08 MED ORDER — ACETAMINOPHEN 325 MG PO TABS: 650.0000 mg | ORAL_TABLET | Freq: Four times a day (QID) | ORAL | Status: DC | PRN

## 2014-01-08 MED ORDER — ALPRAZOLAM 0.5 MG PO TABS
0.5000 mg | ORAL_TABLET | Freq: Three times a day (TID) | ORAL | Status: DC | PRN
  Administered 2014-01-08 (×2): 0.5 mg via ORAL
  Filled 2014-01-08 (×2): qty 1

## 2014-01-08 MED ORDER — SODIUM CHLORIDE 0.9 % IJ SOLN
3.0000 mL | Freq: Two times a day (BID) | INTRAMUSCULAR | Status: DC
  Administered 2014-01-08 – 2014-01-09 (×3): 3 mL via INTRAVENOUS

## 2014-01-08 MED ORDER — ALBUTEROL SULFATE (2.5 MG/3ML) 0.083% IN NEBU: 3.0000 mL | INHALATION_SOLUTION | RESPIRATORY_TRACT | Status: DC | PRN

## 2014-01-08 MED ORDER — MOMETASONE FURO-FORMOTEROL FUM 100-5 MCG/ACT IN AERO
2.0000 | INHALATION_SPRAY | Freq: Two times a day (BID) | RESPIRATORY_TRACT | Status: DC
  Administered 2014-01-08 – 2014-01-10 (×5): 2 via RESPIRATORY_TRACT
  Filled 2014-01-08: qty 8.8

## 2014-01-08 MED ORDER — ALBUTEROL SULFATE (2.5 MG/3ML) 0.083% IN NEBU
2.5000 mg | INHALATION_SOLUTION | RESPIRATORY_TRACT | Status: DC
  Administered 2014-01-08 (×3): 2.5 mg via RESPIRATORY_TRACT
  Filled 2014-01-08 (×3): qty 3

## 2014-01-08 MED ORDER — IPRATROPIUM BROMIDE 0.02 % IN SOLN
0.5000 mg | RESPIRATORY_TRACT | Status: DC
  Administered 2014-01-08 (×3): 0.5 mg via RESPIRATORY_TRACT
  Filled 2014-01-08 (×3): qty 2.5

## 2014-01-08 MED ORDER — OSELTAMIVIR PHOSPHATE 75 MG PO CAPS
75.0000 mg | ORAL_CAPSULE | Freq: Two times a day (BID) | ORAL | Status: DC
  Administered 2014-01-08 – 2014-01-10 (×6): 75 mg via ORAL
  Filled 2014-01-08 (×8): qty 1

## 2014-01-08 MED ORDER — ALBUTEROL SULFATE (2.5 MG/3ML) 0.083% IN NEBU: 2.5000 mg | INHALATION_SOLUTION | RESPIRATORY_TRACT | Status: DC | PRN

## 2014-01-08 MED ORDER — DM-GUAIFENESIN ER 30-600 MG PO TB12
1.0000 | ORAL_TABLET | Freq: Two times a day (BID) | ORAL | Status: DC
  Administered 2014-01-08 – 2014-01-10 (×5): 1 via ORAL
  Filled 2014-01-08 (×6): qty 1

## 2014-01-08 MED ORDER — ACETAMINOPHEN 650 MG RE SUPP: 650.0000 mg | Freq: Four times a day (QID) | RECTAL | Status: DC | PRN

## 2014-01-08 MED ORDER — ENOXAPARIN SODIUM 40 MG/0.4ML ~~LOC~~ SOLN
40.0000 mg | SUBCUTANEOUS | Status: DC
  Administered 2014-01-08 – 2014-01-09 (×2): 40 mg via SUBCUTANEOUS
  Filled 2014-01-08 (×4): qty 0.4

## 2014-01-08 MED ORDER — AZITHROMYCIN 500 MG PO TABS
500.0000 mg | ORAL_TABLET | Freq: Every day | ORAL | Status: DC
  Administered 2014-01-08 – 2014-01-10 (×3): 500 mg via ORAL
  Filled 2014-01-08 (×3): qty 1

## 2014-01-08 NOTE — ED Provider Notes (Signed)
CSN: [id number]     Arrival date & time 01/07/14  2343 History   First MD Initiated Contact with Patient 01/07/14 2347     Chief Complaint  Patient presents with  . Shortness of Breath   (Consider location/radiation/quality/duration/timing/severity/associated sxs/prior Treatment) HPI History provided by patient. Flulike symptoms with onset yesterday. Fever, cough, wheezing, nausea, vomiting, body aches and shortness of breath. She has a history of asthma. She was evaluated at her primary care office, given albuterol neb with minimal relief and sent to the emergency department for further evaluation. In emergency department she had chest x-ray, blood tests, positive flu test, and was started on Tamiflu and azithromycin.  She felt better after receiving more albuterol in the ER and was discharged home. Tonight she is feeling worse again with persistent wheezing and difficulty breathing, low-grade fevers. She is using albuterol every 45 minutes at home without relief. Symptoms moderate severity.   Past Medical History  Diagnosis Date  . Asthma   . Anxiety    Past Surgical History  Procedure Laterality Date  . Tubal ligation    . Bladder surgery  2010   Family History  Problem Relation Age of Onset  . Diabetes Paternal Grandfather    History  Substance Use Topics  . Smoking status: Current Every Day Smoker -- 1.00 packs/day for 7 years    Types: Cigarettes  . Smokeless tobacco: Not on file  . Alcohol Use: No   OB History   Grav Para Term Preterm Abortions TAB SAB Ect Mult Living                 Review of Systems  Constitutional: Positive for fever and chills.  HENT: Negative for trouble swallowing and voice change.   Eyes: Negative for visual disturbance.  Respiratory: Positive for cough and shortness of breath.   Cardiovascular: Positive for chest pain.  Gastrointestinal: Negative for vomiting and abdominal pain.  Genitourinary: Negative for dysuria.  Musculoskeletal:  Negative for neck pain.  Skin: Negative for rash.  Neurological: Negative for headaches.  All other systems reviewed and are negative.    Allergies  Hydromorphone hcl and Ketorolac tromethamine  Home Medications   Current Outpatient Rx  Name  Route  Sig  Dispense  Refill  . ADVAIR DISKUS 250-50 MCG/DOSE AEPB      INHALE 1 PUFF INTO THE LUNGS AT BEDTIME   60 each   2   . albuterol (PROVENTIL HFA;VENTOLIN HFA) 108 (90 BASE) MCG/ACT inhaler   Inhalation   Inhale 2 puffs into the lungs every 4 (four) hours as needed for wheezing or shortness of breath.   18 g   3   . ALPRAZolam (XANAX) 0.5 MG tablet   Oral   Take 1 tablet (0.5 mg total) by mouth 3 (three) times daily as needed for sleep.   30 tablet   1   . azithromycin (ZITHROMAX) 250 MG tablet   Oral   Take 250-500 mg by mouth daily. Take 2 tablets on the first day Take 1 tablet daily         . diphenhydrAMINE (BENADRYL) 25 MG tablet   Oral   Take 25 mg by mouth as needed for itching.         . levalbuterol (XOPENEX HFA) 45 MCG/ACT inhaler   Inhalation   Inhale 1-2 puffs into the lungs every 4 (four) hours as needed for wheezing.   1 Inhaler   3   . levalbuterol (XOPENEX) 1.25 MG/3ML nebulizer  solution   Nebulization   Take 1.25 mg by nebulization every 4 (four) hours as needed for wheezing.   72 mL   0   . oseltamivir (TAMIFLU) 75 MG capsule   Oral   Take 1 capsule (75 mg total) by mouth every 12 (twelve) hours.   10 capsule   0   . varenicline (CHANTIX CONTINUING MONTH PAK) 1 MG tablet   Oral   Take 1 tablet (1 mg total) by mouth 2 (two) times daily.   60 tablet   2    Pulse 132  Temp(Src) 97.5 F (36.4 C) (Oral)  SpO2 93%  LMP 12/24/2013 Physical Exam  Constitutional: She is oriented to person, place, and time. She appears well-developed and well-nourished.  HENT:  Head: Normocephalic and atraumatic.  Eyes: EOM are normal. No scleral icterus.  Neck: Neck supple.  Cardiovascular:  Regular rhythm and intact distal pulses.   Tachycardic  Pulmonary/Chest: No stridor.  Tachypnea. Bilateral expiratory wheezes with decreased lung sounds.  Abdominal: Soft. She exhibits no distension. There is no tenderness.  Musculoskeletal: Normal range of motion. She exhibits no edema and no tenderness.  Neurological: She is alert and oriented to person, place, and time.  Skin: Skin is warm and dry.    ED Course  Procedures (including critical care time)  Results for orders placed during the hospital encounter of 01/07/14  RAPID STREP SCREEN      Result Value Range   Streptococcus, Group A Screen (Direct) NEGATIVE  NEGATIVE  CBC      Result Value Range   WBC 12.6 (*) 4.0 - 10.5 K/uL   RBC 4.66  3.87 - 5.11 MIL/uL   Hemoglobin 13.9  12.0 - 15.0 g/dL   HCT 45.440.5  09.836.0 - 11.946.0 %   MCV 86.9  78.0 - 100.0 fL   MCH 29.8  26.0 - 34.0 pg   MCHC 34.3  30.0 - 36.0 g/dL   RDW 14.713.1  82.911.5 - 56.215.5 %   Platelets 259  150 - 400 K/uL  BASIC METABOLIC PANEL      Result Value Range   Sodium 135 (*) 137 - 147 mEq/L   Potassium 3.8  3.7 - 5.3 mEq/L   Chloride 102  96 - 112 mEq/L   CO2 18 (*) 19 - 32 mEq/L   Glucose, Bld 135 (*) 70 - 99 mg/dL   BUN 7  6 - 23 mg/dL   Creatinine, Ser 1.300.81  0.50 - 1.10 mg/dL   Calcium 8.6  8.4 - 86.510.5 mg/dL   GFR calc non Af Amer >90  >90 mL/min   GFR calc Af Amer >90  >90 mL/min  URINALYSIS, ROUTINE W REFLEX MICROSCOPIC      Result Value Range   Color, Urine YELLOW  YELLOW   APPearance TURBID (*) CLEAR   Specific Gravity, Urine 1.025  1.005 - 1.030   pH 5.5  5.0 - 8.0   Glucose, UA NEGATIVE  NEGATIVE mg/dL   Hgb urine dipstick NEGATIVE  NEGATIVE   Bilirubin Urine NEGATIVE  NEGATIVE   Ketones, ur NEGATIVE  NEGATIVE mg/dL   Protein, ur NEGATIVE  NEGATIVE mg/dL   Urobilinogen, UA 0.2  0.0 - 1.0 mg/dL   Nitrite NEGATIVE  NEGATIVE   Leukocytes, UA NEGATIVE  NEGATIVE  URINE MICROSCOPIC-ADD ON      Result Value Range   Squamous Epithelial / LPF MANY (*) RARE    Dg Chest 2 View  01/07/2014   CLINICAL DATA:  Asthma  EXAM: CHEST  2 VIEW  COMPARISON:  01/26/2012  FINDINGS: Prominent lung markings in the bases. This was not present previously could be due to acute infection. No lobar infiltrate or effusion. Lung volume normal.  IMPRESSION: Increased lung markings bilaterally which could be due to acute infection such is bronchitis. Fluid overload also could have this appearance.   Electronically Signed   By: Marlan Palau M.D.   On: 01/07/2014 11:36   IV Solu-Medrol. Continuous albuterol treatment. Ativan for associated anxiety.  Previous records reviewed did have positive influenza test from today. Plan continue Tamiflu. Isolation precautions initiated  Room air pulse ox 93% is borderline. Oxygen provided.  12:26 AM discussed with Dr. Allena Katz plan admit telemetry for tachycardia/ asthma exacerbation with influenza.   MDM  Diagnosis: Acute asthma exacerbation with Influenza  Labs and chest x-ray from earlier today reviewed Medications provided: Steroids, albuterol, O2, Ativan MED admit  Sunnie Nielsen, MD 01/08/14 0028

## 2014-01-08 NOTE — H&P (Signed)
[facility] History and Physical  Patient: Amy [name]  ZOX:[id number]  DOB: 1976-03-22  DOS: the patient was seen and examined on 01/08/2014 PCP: Kristian Covey, MD  Chief Complaint: Shortness of breath and cough  HPI: Amy [name] is a 38 y.o. female with Past medical history of asthma. The patient is coming from home. Patient presents with complaints of cough that has been ongoing since last 3 days associated with shortness of breath and wheezing she also had an episode of nausea for by vomiting which was posttussive. She complains of some chest tightness which is across her chest and also has some heartburn. She denies any fever or chills she denies any abdominal pain diarrhea constipation burning urination or rash or recent travel. She denies any recent antibiotic use or hospitalization or being on steroids. She was seen in the ED and was given nebulizing treatment to take at home as well as order Tamiflu and azithromycin. And despite using her Xopenex nebulization at home every 45 minutes her breathing was not getting better and therefore she came to the hospital.  Review of Systems: as mentioned in the history of present illness.  A Comprehensive review of the other systems is negative.  Past Medical History  Diagnosis Date  . Asthma   . Anxiety    Past Surgical History  Procedure Laterality Date  . Tubal ligation    . Bladder surgery  2010   Social History:  reports that she has been smoking Cigarettes.  She has a 7 pack-year smoking history. She does not have any smokeless tobacco history on file. She reports that she does not drink alcohol. Her drug history is not on file. Independent for most of her  ADL.  Allergies  Allergen Reactions  . Hydromorphone Hcl     REACTION: itiching, swelling No opioids.  . Ketorolac Tromethamine     REACTION: hives, itiching    Family History  Problem Relation Age of Onset  . Diabetes Paternal Grandfather     Prior to Admission  medications   Medication Sig Start Date End Date Taking? Authorizing Provider  ADVAIR DISKUS 250-50 MCG/DOSE AEPB INHALE 1 PUFF INTO THE LUNGS AT BEDTIME 01/02/14  Yes Gordy Savers, MD  albuterol (PROVENTIL HFA;VENTOLIN HFA) 108 (90 BASE) MCG/ACT inhaler Inhale 2 puffs into the lungs every 4 (four) hours as needed for wheezing or shortness of breath. 10/25/13  Yes Gordy Savers, MD  ALPRAZolam Prudy Feeler) 0.5 MG tablet Take 1 tablet (0.5 mg total) by mouth 3 (three) times daily as needed for sleep. 09/12/13  Yes Kristian Covey, MD  azithromycin (ZITHROMAX) 250 MG tablet Take 250-500 mg by mouth daily. Take 2 tablets on the first day Take 1 tablet daily   Yes Historical Provider, MD  diphenhydrAMINE (BENADRYL) 25 MG tablet Take 25 mg by mouth as needed for itching.   Yes Historical Provider, MD  levalbuterol [facility] HFA) 45 MCG/ACT inhaler Inhale 1-2 puffs into the lungs every 4 (four) hours as needed for wheezing. 10/25/13  Yes Gordy Savers, MD  levalbuterol Pauline Aus) 1.25 MG/3ML nebulizer solution Take 1.25 mg by nebulization every 4 (four) hours as needed for wheezing. 01/07/14  Yes Trevor Mace, PA-C  oseltamivir (TAMIFLU) 75 MG capsule Take 1 capsule (75 mg total) by mouth every 12 (twelve) hours. 01/07/14  Yes Trevor Mace, PA-C  varenicline (CHANTIX CONTINUING MONTH PAK) 1 MG tablet Take 1 tablet (1 mg total) by mouth 2 (two) times daily. 10/25/13  Yes Theron Arista  Lysle Dingwall, MD    Physical Exam: Filed Vitals:   01/08/14 0001 01/08/14 0024 01/08/14 0032  BP:  112/71   Pulse: 132    Temp: 97.5 F (36.4 C)    TempSrc: Oral    SpO2: 93%  93%    General: Alert, Awake and Oriented to Time, Place and Person. Appear in moderate distress Eyes: PERRL ENT: Oral Mucosa clear moist. Neck: No JVD Cardiovascular: S1 and S2 Present, no Murmur, Peripheral Pulses Present Respiratory: Bilateral Air entry equal and Decreased, no Crackles, extensive expiratory wheezes Abdomen: Bowel  Sound Present, Soft and Non tender Skin: No Rash Extremities: No Pedal edema, no calf tenderness Neurologic: Grossly Unremarkable.  Labs on Admission:  CBC:  Recent Labs Lab 01/07/14 1050  WBC 12.6*  HGB 13.9  HCT 40.5  MCV 86.9  PLT 259    CMP     Component Value Date/Time   NA 135* 01/07/2014 1050   K 3.8 01/07/2014 1050   CL 102 01/07/2014 1050   CO2 18* 01/07/2014 1050   GLUCOSE 135* 01/07/2014 1050   BUN 7 01/07/2014 1050   CREATININE 0.81 01/07/2014 1050   CALCIUM 8.6 01/07/2014 1050   PROT 7.1 04/05/2013 0938   ALBUMIN 4.1 04/05/2013 0938   AST 20 04/05/2013 0938   ALT 16 04/05/2013 0938   ALKPHOS 63 04/05/2013 0938   BILITOT 0.3 04/05/2013 0938   GFRNONAA >90 01/07/2014 1050   GFRAA >90 01/07/2014 1050    No results found for this basename: LIPASE, AMYLASE,  in the last 168 hours No results found for this basename: AMMONIA,  in the last 168 hours  No results found for this basename: CKTOTAL, CKMB, CKMBINDEX, TROPONINI,  in the last 168 hours BNP (last 3 results) No results found for this basename: PROBNP,  in the last 8760 hours  Radiological Exams on Admission: Dg Chest 2 View  01/07/2014   CLINICAL DATA:  Asthma  EXAM: CHEST  2 VIEW  COMPARISON:  01/26/2012  FINDINGS: Prominent lung markings in the bases. This was not present previously could be due to acute infection. No lobar infiltrate or effusion. Lung volume normal.  IMPRESSION: Increased lung markings bilaterally which could be due to acute infection such is bronchitis. Fluid overload also could have this appearance.   Electronically Signed   By: Marlan Palau M.D.   On: 01/07/2014 11:36     Assessment/Plan Active Problems:   Asthma exacerbation   1. Asthma exacerbation The patient is presenting with expiratory wheezing with complaints of cough and shortness of breath. Her flu influenza PCR was positive. She was started on Tamiflu and oral azithromycin despite which her symptoms or worsen and therefore she  is now being admitted to the hospital. Considering her tachycardia she may require Xopenex nebulization I would defer the decision to respiratory therapist. I will place her on duo nebs, IV Solu-Medrol 60 mg every 6 hours, oral azithromycin and older Tamiflu, IV ceftriaxone. I would provide her oxygen as needed as well as Mucinex. I would also check pregnancy test prior to this workup  2. Chest tightness Sinus tachycardia on telemetry, less likely cardiac etiology since she has x-ray evidence of bronchitis as well as expiratory wheezing. Continue to monitor.  DVT Prophylaxis: subcutaneous Heparin Nutrition: Advance as tolerated regular diet  Code Status: Full  Family Communication: Husband was present at bedside, opportunity was given to ask question and all questions were answered satisfactorily at the time of interview. Disposition: Admitted to inpatient in  telemetry unit.  Author: Lynden OxfordPranav Zyann Mabry, MD [facility] Pager: [phone] 01/08/2014, 12:42 AM    If 7PM-7AM, please contact night-coverage [email or web address] Password [email or web address]

## 2014-01-08 NOTE — Progress Notes (Signed)
ANTIBIOTIC CONSULT NOTE - INITIAL  Pharmacy Consult for Rocephin Indication: Acute asthma exacerbation with influenza  Allergies  Allergen Reactions  . Hydromorphone Hcl     REACTION: itiching, swelling No opioids.  . Ketorolac Tromethamine     REACTION: hives, itiching    Patient Measurements:    Vital Signs: Temp: 97.5 F (36.4 C) (01/14 0001) Temp src: Oral (01/14 0001) BP: 112/71 mmHg (01/14 0024) Pulse Rate: 132 (01/14 0001) Intake/Output from previous day:   Intake/Output from this shift:    Labs:  Recent Labs  01/07/14 1050  WBC 12.6*  HGB 13.9  PLT 259  CREATININE 0.81   The CrCl is unknown because both a height and weight (above a minimum accepted value) are required for this calculation. No results found for this basename: VANCOTROUGH, Leodis BinetVANCOPEAK, VANCORANDOM, GENTTROUGH, GENTPEAK, GENTRANDOM, TOBRATROUGH, TOBRAPEAK, TOBRARND, AMIKACINPEAK, AMIKACINTROU, AMIKACIN,  in the last 72 hours   Microbiology: Recent Results (from the past 720 hour(s))  RAPID STREP SCREEN     Status: None   Collection Time    01/07/14 10:55 AM      Result Value Range Status   Streptococcus, Group A Screen (Direct) NEGATIVE  NEGATIVE Final   Comment: (NOTE)     A Rapid Antigen test may result negative if the antigen level in the     sample is below the detection level of this test. The FDA has not     cleared this test as a stand-alone test therefore the rapid antigen     negative result has reflexed to a Group A Strep culture.    Medical History: Past Medical History  Diagnosis Date  . Asthma   . Anxiety     Medications:  Scheduled:  . albuterol  2.5 mg Nebulization Q4H  . azithromycin  500 mg Oral Daily  . enoxaparin (LOVENOX) injection  40 mg Subcutaneous Q24H  . guaiFENesin  600 mg Oral BID  . ipratropium  0.5 mg Nebulization Q4H  . methylPREDNISolone (SOLU-MEDROL) injection  60 mg Intravenous Q6H  . mometasone-formoterol  2 puff Inhalation BID  . oseltamivir   75 mg Oral Q12H  . sodium chloride  3 mL Intravenous Q12H   Infusions:  . sodium chloride 125 mL/hr at 01/08/14 0016  . sodium chloride    . cefTRIAXone (ROCEPHIN)  IV     Assessment:  [age] yr female with complaint of SOB and flu-like symptoms; h/o asthma  Chest Xray shows possible acute infection such as bronchitis  Azithromycin 500mg  po daily and Tamiflu 75mg  BID x 5 days ordered by MD  MD has asked pharmacy to dose Rocephin for asthma exacerbation  Scr = 0.81  No adjustment of Rocephin dosing necessary  Goal of Therapy:  Treament of infection  Plan:  Rocephin 1gm IV q24h Will sign off as no further adjustment necessary for Rocephin needed.  Please re-consult pharmacy if additional assistance is necessary.  Thank you, [name], [name], PharmD 01/08/2014,12:51 AM

## 2014-01-08 NOTE — Progress Notes (Signed)
[facility] PROGRESS NOTE  Amy Amy [name] ZOX:[id number] DOB: Mar 31, 1976 DOA: 01/07/2014 PCP: Kristian Covey, MD  Assessment/Plan: Influenza A  -Continue Tamiflu  CAP  -Continue azithromycin and ceftriaxone -Start Mucinex DM -Start continuous pulse ox; titrate O2 to SpO2> 92%  Asthma exacerbation -Poorly controlled asthma, patient requires daily rescue inhaler+ Advair -See CAP/ influenza A  -Continue high-dose steroid -DC albuterol, start DuoNeb   Chest tightness  Sinus tachycardia on telemetry, less likely cardiac etiology since she has x-ray evidence of bronchitis as well as expiratory wheezing. Most likely secondary to a superinfection overlaying a viral infection the -Poorly controlled asthma is contributing factor -Anxiety concerning factor  Anxiety -Start chlordiazepoxide, DC Xanax  Tobacco abuse -Counseled patient on spell of continuing to smoke, currently does not request any help with stopping.  Code Status: Full Family Communication: Spoke with Amy Amy [name] (husband) concerning plan of care Disposition Plan: Resolution of SOB   Consultants:    Procedures: CXR 01/07/2014 Increased lung markings bilaterally which could be due to acute  infection such is bronchitis. Fluid overload also could have this  appearance.   Antibiotics: Azithromycin 1/14>> Ceftriaxone 1/14>> Tamiflu 1/14>>    HPI/Subjective: Amy Amy [name] is a 38 y.o. PMHx anxiety, asthma. Presented to ED from home with c/o. cough that has been ongoing since last 3 days associated with shortness of breath and wheezing she also had an episode of nausea for by vomiting which was posttussive. She complains of some chest tightness which is across her chest and also has some heartburn. She denies any fever or chills she denies any abdominal pain diarrhea constipation burning urination or rash or recent travel. She denies any recent antibiotic use or hospitalization or being on steroids. She was seen in the ED  and was given nebulizing treatment to take at home as well as order Tamiflu and azithromycin. And despite using her Xopenex nebulization at home every 45 minutes her breathing was not getting better and therefore she came to the hospital. 01/08/2014 patient's influenza swab returned positive for Amy [name] influenza A and B., patient's rapid strep screen negative, with culture pending   Objective: Filed Vitals:   01/08/14 0435 01/08/14 0457 01/08/14 0545 01/08/14 0912  BP:   123/75   Pulse:   108   Temp:   97.8 F (36.6 C)   TempSrc:   Oral   Resp:   20   Height:      Weight:      SpO2: 90% 95% 96% 97%    Intake/Output Summary (Last 24 hours) at 01/08/14 1140 Last data filed at 01/08/14 1100  Gross per 24 hour  Intake 1265.42 ml  Output    300 ml  Net 965.42 ml   Filed Weights   01/08/14 0210  Weight: 94.5 kg (208 lb 5.4 oz)    Exam:   General:  A./O. x4, moderate respiratory distress, can speak in 5 word sentences only.  Cardiovascular: Tachycardic, regular rhythm, negative murmurs rubs or gallops, DP/PT pulse +1 bilateral  Respiratory: Diffuse expiratory wheezing  Abdomen: Soft, nontender, nontender, plus bowel sound  Musculoskeletal: Negative pedal edema   Data Reviewed: Basic Metabolic Panel:  Recent Labs Lab 01/07/14 1050 01/08/14 0620  NA 135* 137  K 3.8 3.7  CL 102 106  CO2 18* 18*  GLUCOSE 135* 212*  BUN 7 5*  CREATININE 0.81 0.65  CALCIUM 8.6 8.0*   Liver Function Tests:  Recent Labs Lab 01/08/14 0620  AST 15  ALT 8  ALKPHOS 53  BILITOT <  0.2*  PROT 6.3  ALBUMIN 3.0*   No results found for this basename: LIPASE, AMYLASE,  in the last 168 hours No results found for this basename: AMMONIA,  in the last 168 hours CBC:  Recent Labs Lab 01/07/14 1050 01/08/14 0620  WBC 12.6* 9.8  HGB 13.9 12.9  HCT 40.5 37.5  MCV 86.9 87.6  PLT 259 256   Cardiac Enzymes: No results found for this basename: CKTOTAL, CKMB, CKMBINDEX, TROPONINI,  in the  last 168 hours BNP (last 3 results) No results found for this basename: PROBNP,  in the last 8760 hours CBG: No results found for this basename: GLUCAP,  in the last 168 hours  Recent Results (from the past 240 hour(s))  RAPID STREP SCREEN     Status: None   Collection Time    01/07/14 10:55 AM      Result Value Range Status   Streptococcus, Group A Screen (Direct) NEGATIVE  NEGATIVE Final   Comment: (NOTE)     A Rapid Antigen test may result negative if the antigen level in the     sample is below the detection level of this test. The FDA has not     cleared this test as a stand-alone test therefore the rapid antigen     negative result has reflexed to a Group A Strep culture.     Studies: Dg Chest 2 View  01/07/2014   CLINICAL DATA:  Asthma  EXAM: CHEST  2 VIEW  COMPARISON:  01/26/2012  FINDINGS: Prominent lung markings in the bases. This was not present previously could be due to acute infection. No lobar infiltrate or effusion. Lung volume normal.  IMPRESSION: Increased lung markings bilaterally which could be due to acute infection such is bronchitis. Fluid overload also could have this appearance.   Electronically Signed   By: Marlan Palauharles  Clark M.D.   On: 01/07/2014 11:36    Scheduled Meds: . albuterol  2.5 mg Nebulization Q4H  . azithromycin  500 mg Oral Daily  . cefTRIAXone (ROCEPHIN)  IV  1 g Intravenous Q24H  . enoxaparin (LOVENOX) injection  40 mg Subcutaneous Q24H  . guaiFENesin  600 mg Oral BID  . [START ON 01/09/2014] influenza vac split quadrivalent PF  0.5 mL Intramuscular Tomorrow-1000  . ipratropium  0.5 mg Nebulization Q4H  . methylPREDNISolone (SOLU-MEDROL) injection  60 mg Intravenous Q6H  . mometasone-formoterol  2 puff Inhalation BID  . oseltamivir  75 mg Oral Q12H  . sodium chloride  3 mL Intravenous Q12H   Continuous Infusions: . sodium chloride 125 mL/hr at 01/08/14 0016  . sodium chloride 50 mL/hr at 01/08/14 0402    Principal Problem:   Asthma  exacerbation    Time spent: 50 minutes    WOODS, CURTIS, J  [facility] Pager [phone]. If 7PM-7AM, please contact night-coverage at [email or web address], password [email or web address] 01/08/2014, 11:40 AM  LOS: 1 day

## 2014-01-09 ENCOUNTER — Telehealth: Payer: Self-pay | Admitting: Family Medicine

## 2014-01-09 DIAGNOSIS — J45909 Unspecified asthma, uncomplicated: Secondary | ICD-10-CM

## 2014-01-09 LAB — CULTURE, GROUP A STREP

## 2014-01-09 MED ORDER — LEVALBUTEROL HCL 0.63 MG/3ML IN NEBU
0.6300 mg | INHALATION_SOLUTION | Freq: Four times a day (QID) | RESPIRATORY_TRACT | Status: DC
  Administered 2014-01-09 – 2014-01-10 (×6): 0.63 mg via RESPIRATORY_TRACT
  Filled 2014-01-09 (×15): qty 3

## 2014-01-09 MED ORDER — INFLUENZA VAC SPLIT QUAD 0.5 ML IM SUSP
0.5000 mL | INTRAMUSCULAR | Status: AC
  Filled 2014-01-09 (×2): qty 0.5

## 2014-01-09 MED ORDER — IPRATROPIUM BROMIDE 0.02 % IN SOLN
0.5000 mg | Freq: Four times a day (QID) | RESPIRATORY_TRACT | Status: DC
  Administered 2014-01-09 – 2014-01-10 (×6): 0.5 mg via RESPIRATORY_TRACT
  Filled 2014-01-09 (×5): qty 2.5

## 2014-01-09 NOTE — Progress Notes (Signed)
[facility] PROGRESS NOTE  Amy [name] ZOX:[id number] DOB: 09/27/1976 DOA: 01/07/2014 PCP: Kristian CoveyBURCHETTE,BRUCE W, MD  Assessment/Plan: Influenza A  -Continue Tamiflu  CAP  -Continue azithromycin and ceftriaxone -Continue Mucinex DM -Continue continuous pulse ox; titrate O2 to SpO2> 92%; NOTE patient continues on 3 L [place] with SpO2= 96% -Ambulatory SpO2 on room air = 94%  -May be able to discharge patient in the a.m. with continued improvement   Asthma exacerbation -Poorly controlled asthma, patient requires daily rescue inhaler+ Advair. Will need to see pulmonologist and develop better asthma plan -See CAP/ influenza A  -Continue high-dose steroid -Continue DuoNeb   Chest tightness  Sinus tachycardia on telemetry, patient just finished ambulating in hallway and heart rate has not return to resting state.  -Poorly controlled asthma is contributing factor   Anxiety -Continue chlordiazepoxide; anxiety better controlled  Tobacco abuse -Counseled patient on sequela of continuing to smoke, currently does not request any help with stopping.  Code Status: Full Family Communication: Spoke with Amy [name] (husband) concerning plan of care Disposition Plan: Resolution of SOB   Consultants:    Procedures: CXR 01/07/2014 Increased lung markings bilaterally which could be due to acute  infection such is bronchitis. Fluid overload also could have this  appearance.   Antibiotics: Azithromycin 1/14>> Ceftriaxone 1/14>> Tamiflu 1/14>>    HPI/Subjective: Amy [name] is a 38 y.o. PMHx anxiety, asthma. Presented to ED from home with c/o. cough that has been ongoing since last 3 days associated with shortness of breath and wheezing she also had an episode of nausea for by vomiting which was posttussive. She complains of some chest tightness which is across her chest and also has some heartburn. She denies any fever or chills she denies any abdominal pain diarrhea constipation burning  urination or rash or recent travel. She denies any recent antibiotic use or hospitalization or being on steroids. She was seen in the ED and was given nebulizing treatment to take at home as well as order Tamiflu and azithromycin. And despite using her Xopenex nebulization at home every 45 minutes her breathing was not getting better and therefore she came to the hospital. 01/08/2014 patient's influenza swab returned positive for both influenza A and B., patient's rapid strep screen negative, with culture pending 01/09/2014 patient states feeling much improved, still DOE, negative CP, decreased chest tightness with current treatment  Objective: Filed Vitals:   01/08/14 2319 01/09/14 0201 01/09/14 0625 01/09/14 0840  BP: 130/78  130/82   Pulse: 117  112   Temp: 98.3 F (36.8 C)  98.4 F (36.9 C)   TempSrc: Oral  Oral   Resp: 18  20   Height:      Weight:      SpO2: 97% 97% 91% 96%    Intake/Output Summary (Last 24 hours) at 01/09/14 1117 Last data filed at 01/09/14 0145  Gross per 24 hour  Intake 1174.17 ml  Output      0 ml  Net 1174.17 ml   Filed Weights   01/08/14 0210  Weight: 94.5 kg (208 lb 5.4 oz)    Exam:   General:  A./O. x4, mild respiratory distress, can speak in full sentences.  Cardiovascular: Tachycardic, regular rhythm, negative murmurs rubs or gallops, DP/PT pulse +1 bilateral  Respiratory: Mild Diffuse expiratory wheezing; greatly improved from yesterday  Abdomen: Soft, nontender, nontender, plus bowel sound  Musculoskeletal: Negative pedal edema   Data Reviewed: Basic Metabolic Panel:  Recent Labs Lab 01/07/14 1050 01/08/14 0620  NA 135* 137  K 3.8 3.7  CL 102 106  CO2 18* 18*  GLUCOSE 135* 212*  BUN 7 5*  CREATININE 0.81 0.65  CALCIUM 8.6 8.0*   Liver Function Tests:  Recent Labs Lab 01/08/14 0620  AST 15  ALT 8  ALKPHOS 53  BILITOT <0.2*  PROT 6.3  ALBUMIN 3.0*   No results found for this basename: LIPASE, AMYLASE,  in the last  168 hours No results found for this basename: AMMONIA,  in the last 168 hours CBC:  Recent Labs Lab 01/07/14 1050 01/08/14 0620  WBC 12.6* 9.8  HGB 13.9 12.9  HCT 40.5 37.5  MCV 86.9 87.6  PLT 259 256   Cardiac Enzymes: No results found for this basename: CKTOTAL, CKMB, CKMBINDEX, TROPONINI,  in the last 168 hours BNP (last 3 results) No results found for this basename: PROBNP,  in the last 8760 hours CBG: No results found for this basename: GLUCAP,  in the last 168 hours  Recent Results (from the past 240 hour(s))  RAPID STREP SCREEN     Status: None   Collection Time    01/07/14 10:55 AM      Result Value Range Status   Streptococcus, Group A Screen (Direct) NEGATIVE  NEGATIVE Final   Comment: (NOTE)     A Rapid Antigen test may result negative if the antigen level in the     sample is below the detection level of this test. The FDA has not     cleared this test as a stand-alone test therefore the rapid antigen     negative result has reflexed to a Group A Strep culture.  CULTURE, GROUP A STREP     Status: None   Collection Time    01/07/14 10:55 AM      Result Value Range Status   Specimen Description THROAT   Final   Special Requests NONE   Final   Culture     Final   Value: NO SUSPICIOUS COLONIES, CONTINUING TO HOLD     Performed at [facility]   Report Status PENDING   Incomplete     Studies: Dg Chest 2 View  01/07/2014   CLINICAL DATA:  Asthma  EXAM: CHEST  2 VIEW  COMPARISON:  01/26/2012  FINDINGS: Prominent lung markings in the bases. This was not present previously could be due to acute infection. No lobar infiltrate or effusion. Lung volume normal.  IMPRESSION: Increased lung markings bilaterally which could be due to acute infection such is bronchitis. Fluid overload also could have this appearance.   Electronically Signed   By: Marlan Palau M.D.   On: 01/07/2014 11:36    Scheduled Meds: . azithromycin  500 mg Oral Daily  . cefTRIAXone  (ROCEPHIN)  IV  1 g Intravenous Q24H  . chlordiazePOXIDE  5 mg Oral BID  . dextromethorphan-guaiFENesin  1 tablet Oral BID  . enoxaparin (LOVENOX) injection  40 mg Subcutaneous Q24H  . [START ON 01/10/2014] influenza vac split quadrivalent PF  0.5 mL Intramuscular Tomorrow-1000  . ipratropium  0.5 mg Nebulization Q6H  . levalbuterol  0.63 mg Nebulization Q6H  . methylPREDNISolone (SOLU-MEDROL) injection  60 mg Intravenous Q24H  . mometasone-formoterol  2 puff Inhalation BID  . oseltamivir  75 mg Oral Q12H  . sodium chloride  3 mL Intravenous Q12H   Continuous Infusions: . sodium chloride 125 mL/hr at 01/08/14 0016  . sodium chloride 50 mL/hr at 01/08/14 0402    Principal Problem:   Asthma exacerbation  Active Problems:   DEPRESSION   Tobacco abuse   CAP (community acquired pneumonia)   Chest tightness   Anxiety state, unspecified   Influenza due to identified novel influenza A virus with other respiratory manifestations    Time spent: 50 minutes    WOODS, CURTIS, J  [facility] Pager [phone]. If 7PM-7AM, please contact night-coverage at [email or web address], password [facility] 01/09/2014, 11:17 AM  LOS: 2 days

## 2014-01-09 NOTE — Progress Notes (Signed)
SATURATION QUALIFICATIONS: (This note is used to comply with regulatory documentation for home oxygen)  Patient Saturations on Room Air at Rest = 97%  Patient Saturations on Room Air while Ambulating = 94%  Patient Saturations on 0 Liters of oxygen while Ambulating = 94%  Please briefly explain why patient needs home oxygen: 

## 2014-01-09 NOTE — Telephone Encounter (Signed)
fyi Pt is calling to let dr burchette know dr Selena Battenkim put her in hospital.

## 2014-01-09 NOTE — Progress Notes (Signed)
Inpatient Diabetes Program Recommendations  AACE/ADA: New Consensus Statement on Inpatient Glycemic Control (2013)  Target Ranges:  Prepandial:   less than 140 mg/dL      Peak postprandial:   less than 180 mg/dL (1-2 hours)      Critically ill patients:  140 - 180 mg/dL   Reason for Visit: Hyperglycemia Results for Amy [name], Amy [name] (MRN [id number]) as of 01/09/2014 12:30  Ref. Range 01/07/2014 10:50 01/08/2014 06:20  Glucose Latest Range: 70-99 mg/dL 409135 (H) 811212 (H)   Hyperglycemia likely steroid-induced.  No hx DM, although there is a +family hx DM.  Inpatient Diabetes Program Recommendations Correction (SSI): Add Novolog sensitive tidwc and hs HgbA1C: Check HgbA1C to assess glycemic control prior to hospitalization Diet: Change diet to CHO mod med  Note: Will continue to follow. Thank you. Amy [name], RD, LDN, CDE Inpatient Diabetes Coordinator [phone]

## 2014-01-10 MED ORDER — AZITHROMYCIN 500 MG PO TABS: 500.0000 mg | ORAL_TABLET | Freq: Every day | ORAL | Status: DC

## 2014-01-10 MED ORDER — CHLORDIAZEPOXIDE HCL 5 MG PO CAPS: 5.0000 mg | ORAL_CAPSULE | Freq: Two times a day (BID) | ORAL | Status: DC

## 2014-01-10 MED ORDER — DM-GUAIFENESIN ER 30-600 MG PO TB12: 1.0000 | ORAL_TABLET | Freq: Two times a day (BID) | ORAL | Status: DC

## 2014-01-10 MED ORDER — OSELTAMIVIR PHOSPHATE 75 MG PO CAPS: 75.0000 mg | ORAL_CAPSULE | Freq: Two times a day (BID) | ORAL | Status: DC

## 2014-01-10 MED ORDER — CEFIXIME 400 MG PO TABS: 400.0000 mg | ORAL_TABLET | Freq: Every day | ORAL | Status: DC

## 2014-01-10 NOTE — Progress Notes (Signed)
Telemetry strips for this am [facility]  Pt was discharged prior to RN viewing strips in system.  Centralized telemetry notified and RN requested for am tele strip to be made available for RN review.  Centralized telemetry unable to re-enter pt into system.  This pt had no alarms prior to discharge. Amy [name], Amy [name]

## 2014-01-10 NOTE — Discharge Summary (Signed)
Physician Discharge Summary  Amy [name] ZHY:[id number] DOB: 12/14/1976 DOA: 01/07/2014  PCP: Kristian CoveyBURCHETTE,BRUCE W, MD  Admit date: 01/07/2014 Discharge date: 01/10/2014  Time spent: 40 minutes  Recommendations for Outpatient Follow-up: Influenza A  -Continue Tamiflu   CAP  -Continue azithromycin and Suprax until completely gone to complete a seven-day course. -Continue Mucinex DM  --Ambulatory SpO2 on room air = 94%    Asthma exacerbation  -Poorly controlled asthma, patient requires daily rescue inhaler+ Advair. Will need to see pulmonologist and develop better asthma plan. Counseled patient to call [place]  pulmonology in 6-8 weeks to schedule spirometry pre-/post bronchodilator, DLCO -See CAP/ influenza A  -Patient to restart her home Advair, Xopenex medication regimen  Chest tightness  -Resolved except for when patient exerts herself. Counseled patient would be several weeks before her lungs fully recovered from her infection    Anxiety  -Continue chlordiazepoxide; for control of anxiety. Much better long term medications and Xanax    Tobacco abuse  -Counseled patient on sequela of continuing to smoke, currently does not request any help with stopping.    Discharge Diagnoses:  Principal Problem:   Asthma exacerbation Active Problems:   DEPRESSION   Tobacco abuse   CAP (community acquired pneumonia)   Chest tightness   Anxiety state, unspecified   Influenza due to identified novel influenza A virus with other respiratory manifestations   Discharge Condition: Stable  Diet recommendation: Regular  Filed Weights   01/08/14 0210  Weight: 94.5 kg (208 lb 5.4 oz)    History of present illness:  Amy [name] is a 38 y.o.WF  PMHx anxiety, asthma. Presented to ED from home with c/o. cough that has been ongoing since last 3 days associated with shortness of breath and wheezing she also had an episode of nausea for by vomiting which was posttussive. She complains of some chest  tightness which is across her chest and also has some heartburn. She denies any fever or chills she denies any abdominal pain diarrhea constipation burning urination or rash or recent travel. She denies any recent antibiotic use or hospitalization or being on steroids. She was seen in the ED and was given nebulizing treatment to take at home as well as order Tamiflu and azithromycin. And despite using her Xopenex nebulization at home every 45 minutes her breathing was not getting better and therefore she came to the hospital. 01/08/2014 patient's influenza swab returned positive for both influenza A and B., patient's rapid strep screen negative, with culture pending 01/09/2014 patient states feeling much improved, still DOE, negative CP, decreased chest tightness with current treatment 01/10/2014 patient states feels improved, however still feels weak. Patient able to ambulate with decreased DOE, and when sitting still negative SOB. Patient understands that she will have to complete her full course of antibiotics, and follow up with pulmonology in approximately 6-8 weeks for spirometry in order to be placed on a better regimen to control her asthma. Patient also understands that she must continue to not smoke.   Procedures:  CXR 01/07/2014  Increased lung markings bilaterally which could be due to acute  infection such is bronchitis. Fluid overload also could have this  Appearance.   Antibiotics:  Azithromycin 1/14>> stopped on 1/20 Ceftriaxone 1/14>> stopped on 1/20  Tamiflu 1/14>> stopped on 1/18   Discharge Exam: Filed Vitals:   01/09/14 2105 01/10/14 0127 01/10/14 0553 01/10/14 0915  BP: 140/82  121/64   Pulse: 101  80   Temp: 97.8 F (36.6 C)  98.3 F (  36.8 C)   TempSrc: Oral  Oral   Resp: 19  19   Height:      Weight:      SpO2: 96% 100% 94% 93%    General: A./O. x4, mild respiratory distress, can speak in full sentences.  Cardiovascular: Tachycardic, regular rhythm, negative  murmurs rubs or gallops, DP/PT pulse +1 bilateral  Respiratory: Barely audible Diffuse expiratory wheezing;   Abdomen: Soft, nontender, nontender, plus bowel sound  Musculoskeletal: Negative pedal edema   Discharge Instructions     Medication List    ASK your doctor about these medications       ADVAIR DISKUS 250-50 MCG/DOSE Aepb  Generic drug:  Fluticasone-Salmeterol  INHALE 1 PUFF INTO THE LUNGS AT BEDTIME     albuterol 108 (90 BASE) MCG/ACT inhaler  Commonly known as:  PROVENTIL HFA;VENTOLIN HFA  Inhale 2 puffs into the lungs every 4 (four) hours as needed for wheezing or shortness of breath.     ALPRAZolam 0.5 MG tablet  Commonly known as:  XANAX  Take 1 tablet (0.5 mg total) by mouth 3 (three) times daily as needed for sleep.     azithromycin 250 MG tablet  Commonly known as:  ZITHROMAX  - Take 250-500 mg by mouth daily. Take 2 tablets on the first day  - Take 1 tablet daily     diphenhydrAMINE 25 MG tablet  Commonly known as:  BENADRYL  Take 25 mg by mouth as needed for itching.     levalbuterol 1.25 MG/3ML nebulizer solution  Commonly known as:  XOPENEX  Take 1.25 mg by nebulization every 4 (four) hours as needed for wheezing.     levalbuterol 45 MCG/ACT inhaler  Commonly known as:  XOPENEX HFA  Inhale 1-2 puffs into the lungs every 4 (four) hours as needed for wheezing.     oseltamivir 75 MG capsule  Commonly known as:  TAMIFLU  Take 1 capsule (75 mg total) by mouth every 12 (twelve) hours.     varenicline 1 MG tablet  Commonly known as:  CHANTIX CONTINUING MONTH PAK  Take 1 tablet (1 mg total) by mouth 2 (two) times daily.       Allergies  Allergen Reactions  . Hydromorphone Hcl     REACTION: itiching, swelling No opioids.  . Ketorolac Tromethamine     REACTION: hives, itiching      The results of significant diagnostics from this hospitalization (including imaging, microbiology, ancillary and laboratory) are listed below for reference.     Significant Diagnostic Studies: Dg Chest 2 View  01/07/2014   CLINICAL DATA:  Asthma  EXAM: CHEST  2 VIEW  COMPARISON:  01/26/2012  FINDINGS: Prominent lung markings in the bases. This was not present previously could be due to acute infection. No lobar infiltrate or effusion. Lung volume normal.  IMPRESSION: Increased lung markings bilaterally which could be due to acute infection such is bronchitis. Fluid overload also could have this appearance.   Electronically Signed   By: Marlan Palau M.D.   On: 01/07/2014 11:36    Microbiology: Recent Results (from the past 240 hour(s))  RAPID STREP SCREEN     Status: None   Collection Time    01/07/14 10:55 AM      Result Value Range Status   Streptococcus, Group A Screen (Direct) NEGATIVE  NEGATIVE Final   Comment: (NOTE)     A Rapid Antigen test may result negative if the antigen level in the     sample  is below the detection level of this test. The FDA has not     cleared this test as a stand-alone test therefore the rapid antigen     negative result has reflexed to a Group A Strep culture.  CULTURE, GROUP A STREP     Status: None   Collection Time    01/07/14 10:55 AM      Result Value Range Status   Specimen Description THROAT   Final   Special Requests NONE   Final   Culture     Final   Value: No Beta Hemolytic Streptococci Isolated     Performed at [facility]   Report Status 01/09/2014 FINAL   Final     Labs: Basic Metabolic Panel:  Recent Labs Lab 01/07/14 1050 01/08/14 0620  NA 135* 137  K 3.8 3.7  CL 102 106  CO2 18* 18*  GLUCOSE 135* 212*  BUN 7 5*  CREATININE 0.81 0.65  CALCIUM 8.6 8.0*   Liver Function Tests:  Recent Labs Lab 01/08/14 0620  AST 15  ALT 8  ALKPHOS 53  BILITOT <0.2*  PROT 6.3  ALBUMIN 3.0*   No results found for this basename: LIPASE, AMYLASE,  in the last 168 hours No results found for this basename: AMMONIA,  in the last 168 hours CBC:  Recent Labs Lab 01/07/14 1050  01/08/14 0620  WBC 12.6* 9.8  HGB 13.9 12.9  HCT 40.5 37.5  MCV 86.9 87.6  PLT 259 256   Cardiac Enzymes: No results found for this basename: CKTOTAL, CKMB, CKMBINDEX, TROPONINI,  in the last 168 hours BNP: BNP (last 3 results) No results found for this basename: PROBNP,  in the last 8760 hours CBG: No results found for this basename: GLUCAP,  in the last 168 hours     Signed:  Carolyne Littles, MD [facility] [phone] pager

## 2014-01-10 NOTE — ED Provider Notes (Signed)
Medical screening examination/treatment/procedure(s) were performed by non-physician practitioner and as supervising physician I was immediately available for consultation/collaboration.  EKG Interpretation   None         Richardean Canalavid H Cyan Clippinger, MD 01/10/14 [phone]

## 2014-03-15 ENCOUNTER — Other Ambulatory Visit: Payer: Self-pay | Admitting: Internal Medicine

## 2014-03-18 ENCOUNTER — Encounter: Payer: 59 | Admitting: Family Medicine

## 2014-03-18 ENCOUNTER — Emergency Department (HOSPITAL_COMMUNITY): Payer: 59

## 2014-03-18 ENCOUNTER — Emergency Department (HOSPITAL_COMMUNITY)
Admission: EM | Admit: 2014-03-18 | Discharge: 2014-03-18 | Disposition: A | Discharge status: Home / Self Care | Payer: 59 | Attending: Emergency Medicine | Admitting: Emergency Medicine

## 2014-03-18 ENCOUNTER — Telehealth: Payer: Self-pay

## 2014-03-18 ENCOUNTER — Encounter (HOSPITAL_COMMUNITY): Payer: Self-pay | Admitting: Emergency Medicine

## 2014-03-18 DIAGNOSIS — R61 Generalized hyperhidrosis: Secondary | ICD-10-CM | POA: Insufficient documentation

## 2014-03-18 DIAGNOSIS — F17200 Nicotine dependence, unspecified, uncomplicated: Secondary | ICD-10-CM | POA: Insufficient documentation

## 2014-03-18 DIAGNOSIS — Z79899 Other long term (current) drug therapy: Secondary | ICD-10-CM | POA: Insufficient documentation

## 2014-03-18 DIAGNOSIS — J40 Bronchitis, not specified as acute or chronic: Secondary | ICD-10-CM

## 2014-03-18 DIAGNOSIS — IMO0002 Reserved for concepts with insufficient information to code with codable children: Secondary | ICD-10-CM | POA: Insufficient documentation

## 2014-03-18 DIAGNOSIS — F411 Generalized anxiety disorder: Secondary | ICD-10-CM | POA: Insufficient documentation

## 2014-03-18 DIAGNOSIS — R000 Tachycardia, unspecified: Secondary | ICD-10-CM | POA: Insufficient documentation

## 2014-03-18 DIAGNOSIS — J45901 Unspecified asthma with (acute) exacerbation: Secondary | ICD-10-CM | POA: Insufficient documentation

## 2014-03-18 LAB — TROPONIN I: Troponin I: <0.3 ng/mL (ref <0.30)

## 2014-03-18 LAB — CBC WITH DIFFERENTIAL/PLATELET
BASOS ABS: 0.1 10*3/uL (ref 0.0–0.1)
BASOS PCT: 1 % (ref 0–1)
EOS ABS: 0.5 10*3/uL (ref 0.0–0.7)
EOS PCT: 4 % (ref 0–5)
HCT: 42.9 % (ref 36.0–46.0)
Hemoglobin: 14.7 g/dL (ref 12.0–15.0)
LYMPHS ABS: 2.9 10*3/uL (ref 0.7–4.0)
Lymphocytes Relative: 21 % (ref 12–46)
MCH: 30 pg (ref 26.0–34.0)
MCHC: 34.3 g/dL (ref 30.0–36.0)
MCV: 87.6 fL (ref 78.0–100.0)
Monocytes Absolute: 0.6 10*3/uL (ref 0.1–1.0)
Monocytes Relative: 5 % (ref 3–12)
NEUTROS PCT: 70 % (ref 43–77)
Neutro Abs: 9.6 10*3/uL — ABNORMAL HIGH (ref 1.7–7.7)
PLATELETS: 417 10*3/uL — AB (ref 150–400)
RBC: 4.9 MIL/uL (ref 3.87–5.11)
RDW: 13.3 % (ref 11.5–15.5)
WBC: 13.7 10*3/uL — ABNORMAL HIGH (ref 4.0–10.5)

## 2014-03-18 LAB — COMPREHENSIVE METABOLIC PANEL
ALT: 7 U/L (ref 0–35)
AST: 16 U/L (ref 0–37)
Albumin: 4.1 g/dL (ref 3.5–5.2)
Alkaline Phosphatase: 72 U/L (ref 39–117)
BUN: 7 mg/dL (ref 6–23)
CALCIUM: 9.9 mg/dL (ref 8.4–10.5)
CO2: 20 mEq/L (ref 19–32)
Chloride: 101 mEq/L (ref 96–112)
Creatinine, Ser: 0.8 mg/dL (ref 0.50–1.10)
GFR calc non Af Amer: >90 mL/min (ref >90)
GLUCOSE: 98 mg/dL (ref 70–99)
POTASSIUM: 3.9 meq/L (ref 3.7–5.3)
Sodium: 140 mEq/L (ref 137–147)
TOTAL PROTEIN: 7.5 g/dL (ref 6.0–8.3)
Total Bilirubin: 0.6 mg/dL (ref 0.3–1.2)

## 2014-03-18 LAB — I-STAT CG4 LACTIC ACID, ED: LACTIC ACID, VENOUS: 2.01 mmol/L (ref 0.5–2.2)

## 2014-03-18 LAB — INFLUENZA PANEL BY PCR (TYPE A & B)
H1N1 flu by pcr: NOT DETECTED
Influenza A By PCR: NEGATIVE
Influenza B By PCR: NEGATIVE

## 2014-03-18 LAB — PRO B NATRIURETIC PEPTIDE: Pro B Natriuretic peptide (BNP): 7.2 pg/mL (ref 0–125)

## 2014-03-18 MED ORDER — LEVOFLOXACIN 500 MG PO TABS
500.0000 mg | ORAL_TABLET | Freq: Once | ORAL | Status: AC
  Administered 2014-03-18: 500 mg via ORAL
  Filled 2014-03-18: qty 1

## 2014-03-18 MED ORDER — LEVOFLOXACIN 500 MG PO TABS: 500.0000 mg | ORAL_TABLET | Freq: Every day | ORAL | Status: AC

## 2014-03-18 MED ORDER — IPRATROPIUM-ALBUTEROL 0.5-2.5 (3) MG/3ML IN SOLN
3.0000 mL | Freq: Once | RESPIRATORY_TRACT | Status: AC
  Administered 2014-03-18: 3 mL via RESPIRATORY_TRACT
  Filled 2014-03-18: qty 3

## 2014-03-18 MED ORDER — ALBUTEROL SULFATE (2.5 MG/3ML) 0.083% IN NEBU: 2.5000 mg | INHALATION_SOLUTION | RESPIRATORY_TRACT | Status: AC

## 2014-03-18 MED ORDER — ALBUTEROL SULFATE (2.5 MG/3ML) 0.083% IN NEBU
5.0000 mg | INHALATION_SOLUTION | Freq: Once | RESPIRATORY_TRACT | Status: AC
  Administered 2014-03-18: 5 mg via RESPIRATORY_TRACT
  Filled 2014-03-18: qty 6

## 2014-03-18 MED ORDER — METHYLPREDNISOLONE SODIUM SUCC 125 MG IJ SOLR
125.0000 mg | Freq: Once | INTRAMUSCULAR | Status: AC
  Administered 2014-03-18: 125 mg via INTRAVENOUS
  Filled 2014-03-18: qty 2

## 2014-03-18 MED ORDER — SODIUM CHLORIDE 0.9 % IV SOLN
Freq: Once | INTRAVENOUS | Status: AC
Start: 2014-03-18 — End: 2014-03-18
  Administered 2014-03-18: 17:00:00 via INTRAVENOUS

## 2014-03-18 MED ORDER — PREDNISONE 20 MG PO TABS: 40.0000 mg | ORAL_TABLET | Freq: Every day | ORAL | Status: AC

## 2014-03-18 NOTE — Telephone Encounter (Signed)
Per Dr. Elmyra RicksKim's request called pt to get more information about her cough and congestion.  Per patient last week she thought it was allergies she was sneezing and "snotty".  This week she has felt like she has had a fever on and off but per pt she had never checked it.  Pt states she would go from chills to sweating the fever out.  Pt sounded out of breath while discussing her symptoms.  Pt states she felt like she did in Jan 2015 when she was admitted to the hospital.  Pt feels she cannot breathe in deep enough to get her inhalers to work.  Pt states she feels jumpy as well.  Per Dr. Selena BattenKim advised pt to go to ED NOW! Pt verbalized understanding.

## 2014-03-18 NOTE — Progress Notes (Signed)
Error   This encounter was created in error - please disregard. 

## 2014-03-18 NOTE — ED Notes (Signed)
Pt. Is not able to use the restroom at this time, but is aware that we need a urine specimen. 

## 2014-03-18 NOTE — Discharge Instructions (Signed)
As discussed, it is important that you follow up as soon as possible with your physician for continued management of your condition. ° °If you develop any new, or concerning changes in your condition, please return to the emergency department immediately. ° °

## 2014-03-18 NOTE — ED Notes (Addendum)
Pt sob, leaning forward for comfort. Pt sent by PCP. Pt has productive cough, rhonchi noted in lungs. Pt speaking 2 words at a time. Pt states sob became worse yesterday. Denies sore throat.

## 2014-03-18 NOTE — ED Provider Notes (Signed)
CSN: [id number]     Arrival date & time 03/18/14  1421 History   First MD Initiated Contact with Patient 03/18/14 1502     Chief Complaint  Patient presents with  . Shortness of Breath     HPI  Patient presents with concern dyspnea, chest pressure. Symptoms began one week ago with head congestion, rhinorrhea.  Subsequently the patient developed cough, chest pressure, mild dyspnea.  Symptoms have been waxing and waning, though more pronounced over the past day. There is no associated fever, syncope, though there is generalized fatigue and lightheadedness. Minimal relief with OTC medication. Patient smokes. Patient was hospitalized 2 months ago for influenza.   Past Medical History  Diagnosis Date  . Asthma   . Anxiety    Past Surgical History  Procedure Laterality Date  . Tubal ligation    . Bladder surgery  2010   Family History  Problem Relation Age of Onset  . Diabetes Paternal Grandfather    History  Substance Use Topics  . Smoking status: Current Every Day Smoker -- 1.00 packs/day for 7 years    Types: Cigarettes  . Smokeless tobacco: Not on file  . Alcohol Use: No   OB History   Grav Para Term Preterm Abortions TAB SAB Ect Mult Living                 Review of Systems  Constitutional:       Per HPI, otherwise negative  HENT:       Per HPI, otherwise negative  Respiratory:       Per HPI, otherwise negative  Cardiovascular:       Per HPI, otherwise negative  Gastrointestinal: Negative for vomiting.  Endocrine:       Negative aside from HPI  Genitourinary:       Neg aside from HPI   Musculoskeletal:       Per HPI, otherwise negative  Skin: Negative.   Neurological: Negative for syncope.      Allergies  Ketorolac tromethamine and Hydromorphone hcl  Home Medications   Current Outpatient Rx  Name  Route  Sig  Dispense  Refill  . albuterol (PROVENTIL HFA;VENTOLIN HFA) 108 (90 BASE) MCG/ACT inhaler   Inhalation   Inhale 1-2 puffs into the lungs  every 6 (six) hours as needed for wheezing or shortness of breath.         Marland Kitchen. albuterol (PROVENTIL) (2.5 MG/3ML) 0.083% nebulizer solution   Nebulization   Take 2.5 mg by nebulization every 6 (six) hours as needed for wheezing or shortness of breath.         . ALPRAZolam (XANAX) 0.5 MG tablet   Oral   Take 0.25 mg by mouth daily as needed for anxiety.         . chlordiazePOXIDE (LIBRIUM) 5 MG capsule   Oral   Take 1 capsule (5 mg total) by mouth 2 (two) times daily.   30 capsule   0   . dextromethorphan-guaiFENesin (MUCINEX DM) 30-600 MG per 12 hr tablet   Oral   Take 1 tablet by mouth 2 (two) times daily.   28 tablet   0   . diphenhydrAMINE (BENADRYL) 25 MG tablet   Oral   Take 25 mg by mouth as needed for itching.         . fluticasone (FLONASE) 50 MCG/ACT nasal spray   Each Nare   Place 2 sprays into both nostrils daily.         .Marland Kitchen  Fluticasone-Salmeterol (ADVAIR DISKUS) 250-50 MCG/DOSE AEPB   Inhalation   Inhale 1 puff into the lungs 2 (two) times daily.         Marland Kitchen ibuprofen (ADVIL,MOTRIN) 200 MG tablet   Oral   Take 800 mg by mouth every 6 (six) hours as needed for moderate pain.         Marland Kitchen levalbuterol (XOPENEX HFA) 45 MCG/ACT inhaler   Inhalation   Inhale 1-2 puffs into the lungs every 4 (four) hours as needed for wheezing.         . levalbuterol (XOPENEX) 1.25 MG/0.5ML nebulizer solution   Nebulization   Take 1.25 mg by nebulization every 4 (four) hours as needed for wheezing or shortness of breath.         . nicotine (NICODERM CQ - DOSED IN MG/24 HOURS) 21 mg/24hr patch   Transdermal   Place 21 mg onto the skin daily.          BP 131/85  Pulse 134  Temp(Src) 97.9 F (36.6 C) (Oral)  Resp 28  SpO2 99% Physical Exam  Nursing note and vitals reviewed. Constitutional: She is oriented to person, place, and time. She appears well-developed and well-nourished. She appears distressed.  HENT:  Head: Normocephalic and atraumatic.   Mouth/Throat: Uvula is midline, oropharynx is clear and moist and mucous membranes are normal.  Eyes: Conjunctivae and EOM are normal.  Cardiovascular: Regular rhythm.  Tachycardia present.   Pulmonary/Chest: Accessory muscle usage present. No stridor. Tachypnea noted. She is in respiratory distress. She has decreased breath sounds. She has wheezes. She has rhonchi.  Abdominal: She exhibits no distension.  Musculoskeletal: She exhibits no edema.  Lymphadenopathy:    She has no cervical adenopathy.  Neurological: She is alert and oriented to person, place, and time. No cranial nerve deficit.  Skin: Skin is warm. She is diaphoretic.  Psychiatric: She has a normal mood and affect.    ED Course  Procedures (including critical care time) Labs Review Labs Reviewed  CBC WITH DIFFERENTIAL  COMPREHENSIVE METABOLIC PANEL  PRO B NATRIURETIC PEPTIDE  URINALYSIS, ROUTINE W REFLEX MICROSCOPIC  TROPONIN I  I-STAT CG4 LACTIC ACID, ED   Imaging Review No results found.   EKG Interpretation   Date/Time:  Tuesday March 18 2014 14:27:20 EDT Ventricular Rate:  120 PR Interval:  124 QRS Duration: 90 QT Interval:  330 QTC Calculation: 466 R Axis:   89 Text Interpretation:  Sinus tachycardia Biatrial enlargement Abnormal ECG  Sinus tachycardia Biatrial enlargement Abnormal ekg Confirmed by Gerhard Munch  MD [id number]) on 03/18/2014 3:21:15 PM     After the initial evaluation I reviewed the patient's chart.  5:04 PM Patient improved following initial neb.  6:37 PM Patient substantially improved MDM   Final diagnoses:  Bronchitis   Patient presents with dyspnea.  On exam she is tachypneic, tachycardic, diaphoretic. Patient has history of asthma, and is a smoker.  No patient also has recent hospitalization for influenza. Patient improved substantially here with multiple albuterol treatments, fluids, steroids.  The patient came in in respiratory distress, with her for which is  appropriate for outpatient followup.     Gerhard Munch, MD 03/18/14 [phone]

## 2014-03-18 NOTE — ED Notes (Signed)
Pt. Is unable to use the restroom at this time, but is aware that we need a urine specimen.  

## 2014-04-02 ENCOUNTER — Emergency Department (HOSPITAL_COMMUNITY)
Admission: EM | Admit: 2014-04-02 | Discharge: 2014-04-02 | Disposition: A | Discharge status: Home / Self Care | Payer: 59 | Attending: Emergency Medicine | Admitting: Emergency Medicine

## 2014-04-02 ENCOUNTER — Encounter (HOSPITAL_COMMUNITY): Payer: Self-pay | Admitting: Emergency Medicine

## 2014-04-02 ENCOUNTER — Emergency Department (HOSPITAL_COMMUNITY): Payer: 59

## 2014-04-02 DIAGNOSIS — F411 Generalized anxiety disorder: Secondary | ICD-10-CM | POA: Insufficient documentation

## 2014-04-02 DIAGNOSIS — Z79899 Other long term (current) drug therapy: Secondary | ICD-10-CM | POA: Insufficient documentation

## 2014-04-02 DIAGNOSIS — F17200 Nicotine dependence, unspecified, uncomplicated: Secondary | ICD-10-CM | POA: Insufficient documentation

## 2014-04-02 DIAGNOSIS — J45901 Unspecified asthma with (acute) exacerbation: Secondary | ICD-10-CM | POA: Insufficient documentation

## 2014-04-02 DIAGNOSIS — R000 Tachycardia, unspecified: Secondary | ICD-10-CM | POA: Insufficient documentation

## 2014-04-02 DIAGNOSIS — J40 Bronchitis, not specified as acute or chronic: Secondary | ICD-10-CM

## 2014-04-02 DIAGNOSIS — IMO0002 Reserved for concepts with insufficient information to code with codable children: Secondary | ICD-10-CM | POA: Insufficient documentation

## 2014-04-02 LAB — CBC WITH DIFFERENTIAL/PLATELET
BASOS PCT: 0 % (ref 0–1)
Basophils Absolute: 0 10*3/uL (ref 0.0–0.1)
EOS PCT: 1 % (ref 0–5)
Eosinophils Absolute: 0.3 10*3/uL (ref 0.0–0.7)
HEMATOCRIT: 42.4 % (ref 36.0–46.0)
Hemoglobin: 14.4 g/dL (ref 12.0–15.0)
Lymphocytes Relative: 7 % — ABNORMAL LOW (ref 12–46)
Lymphs Abs: 1.8 10*3/uL (ref 0.7–4.0)
MCH: 29.6 pg (ref 26.0–34.0)
MCHC: 34 g/dL (ref 30.0–36.0)
MCV: 87.2 fL (ref 78.0–100.0)
MONO ABS: 0.9 10*3/uL (ref 0.1–1.0)
Monocytes Relative: 4 % (ref 3–12)
Neutro Abs: 21.2 10*3/uL — ABNORMAL HIGH (ref 1.7–7.7)
Neutrophils Relative %: 87 % — ABNORMAL HIGH (ref 43–77)
PLATELETS: 391 10*3/uL (ref 150–400)
RBC: 4.86 MIL/uL (ref 3.87–5.11)
RDW: 13.2 % (ref 11.5–15.5)
WBC: 24.2 10*3/uL — AB (ref 4.0–10.5)

## 2014-04-02 LAB — I-STAT CHEM 8, ED
BUN: 8 mg/dL (ref 6–23)
CALCIUM ION: 1.13 mmol/L (ref 1.12–1.23)
CHLORIDE: 107 meq/L (ref 96–112)
CREATININE: 0.8 mg/dL (ref 0.50–1.10)
Glucose, Bld: 122 mg/dL — ABNORMAL HIGH (ref 70–99)
HCT: 45 % (ref 36.0–46.0)
Hemoglobin: 15.3 g/dL — ABNORMAL HIGH (ref 12.0–15.0)
Potassium: 3.8 mEq/L (ref 3.7–5.3)
Sodium: 141 mEq/L (ref 137–147)
TCO2: 20 mmol/L (ref 0–100)

## 2014-04-02 MED ORDER — SODIUM CHLORIDE 0.9 % IV SOLN
1000.0000 mL | Freq: Once | INTRAVENOUS | Status: AC
Start: 2014-04-02 — End: 2014-04-02
  Administered 2014-04-02: 1000 mL via INTRAVENOUS

## 2014-04-02 MED ORDER — PREDNISONE 20 MG PO TABS: ORAL_TABLET | ORAL | Status: DC

## 2014-04-02 MED ORDER — IPRATROPIUM BROMIDE 0.02 % IN SOLN
0.5000 mg | Freq: Once | RESPIRATORY_TRACT | Status: AC
  Administered 2014-04-02: 0.5 mg via RESPIRATORY_TRACT
  Filled 2014-04-02: qty 2.5

## 2014-04-02 MED ORDER — ALBUTEROL SULFATE HFA 108 (90 BASE) MCG/ACT IN AERS: 2.0000 | INHALATION_SPRAY | RESPIRATORY_TRACT | Status: DC | PRN

## 2014-04-02 MED ORDER — DOXYCYCLINE HYCLATE 100 MG PO CAPS: 100.0000 mg | ORAL_CAPSULE | Freq: Two times a day (BID) | ORAL | Status: DC

## 2014-04-02 MED ORDER — ALBUTEROL SULFATE (2.5 MG/3ML) 0.083% IN NEBU
5.0000 mg | INHALATION_SOLUTION | Freq: Once | RESPIRATORY_TRACT | Status: AC
  Administered 2014-04-02: 5 mg via RESPIRATORY_TRACT
  Filled 2014-04-02: qty 6

## 2014-04-02 MED ORDER — ALBUTEROL (5 MG/ML) CONTINUOUS INHALATION SOLN
15.0000 mg/h | INHALATION_SOLUTION | Freq: Once | RESPIRATORY_TRACT | Status: AC
  Administered 2014-04-02: 15 mg/h via RESPIRATORY_TRACT
  Filled 2014-04-02: qty 20

## 2014-04-02 MED ORDER — METHYLPREDNISOLONE SODIUM SUCC 125 MG IJ SOLR
125.0000 mg | Freq: Once | INTRAMUSCULAR | Status: AC
  Administered 2014-04-02: 125 mg via INTRAVENOUS
  Filled 2014-04-02: qty 2

## 2014-04-02 MED ORDER — SODIUM CHLORIDE 0.9 % IV SOLN
1000.0000 mL | INTRAVENOUS | Status: DC
  Administered 2014-04-02: 1000 mL via INTRAVENOUS

## 2014-04-02 MED ORDER — MAGNESIUM SULFATE 40 MG/ML IJ SOLN
2.0000 g | Freq: Once | INTRAMUSCULAR | Status: AC
  Administered 2014-04-02: 2 g via INTRAVENOUS
  Filled 2014-04-02: qty 50

## 2014-04-02 NOTE — Discharge Instructions (Signed)
Drink plenty of fluids. Use the inhaler 2 puffs every 4 hrs as needed for wheezing or shortness of breath. Take the prednisone and antibiotic until gone. Recheck if you get a fever or feel worse. TRY TO STOP SMOKING!!!    Asthma Attack Prevention Although there is no way to prevent asthma from starting, you can take steps to control the disease and reduce its symptoms. Learn about your asthma and how to control it. Take an active role to control your asthma by working with your health care provider to create and follow an asthma action plan. An asthma action plan guides you in:  Taking your medicines properly.  Avoiding things that set off your asthma or make your asthma worse (asthma triggers).  Tracking your level of asthma control.  Responding to worsening asthma.  Seeking emergency care when needed. To track your asthma, keep records of your symptoms, check your peak flow number using a handheld device that shows how well air moves out of your lungs (peak flow meter), and get regular asthma checkups.  WHAT ARE SOME WAYS TO PREVENT AN ASTHMA ATTACK?  Take medicines as directed by your health care provider.  Keep track of your asthma symptoms and level of control.  With your health care provider, write a detailed plan for taking medicines and managing an asthma attack. Then be sure to follow your action plan. Asthma is an ongoing condition that needs regular monitoring and treatment.  Identify and avoid asthma triggers. Many outdoor allergens and irritants (such as pollen, mold, cold air, and air pollution) can trigger asthma attacks. Find out what your asthma triggers are and take steps to avoid them.  Monitor your breathing. Learn to recognize warning signs of an attack, such as coughing, wheezing, or shortness of breath. Your lung function may decrease before you notice any signs or symptoms, so regularly measure and record your peak airflow with a home peak flow meter.  Identify  and treat attacks early. If you act quickly, you are less likely to have a severe attack. You will also need less medicine to control your symptoms. When your peak flow measurements decrease and alert you to an upcoming attack, take your medicine as instructed and immediately stop any activity that may have triggered the attack. If your symptoms do not improve, get medical help.  Pay attention to increasing quick-relief inhaler use. If you find yourself relying on your quick-relief inhaler, your asthma is not under control. See your health care provider about adjusting your treatment. WHAT CAN MAKE MY SYMPTOMS WORSE? A number of common things can set off or make your asthma symptoms worse and cause temporary increased inflammation of your airways. Keep track of your asthma symptoms for several weeks, detailing all the environmental and emotional factors that are linked with your asthma. When you have an asthma attack, go back to your asthma diary to see which factor, or combination of factors, might have contributed to it. Once you know what these factors are, you can take steps to control many of them. If you have allergies and asthma, it is important to take asthma prevention steps at home. Minimizing contact with the substance to which you are allergic will help prevent an asthma attack. Some triggers and ways to avoid these triggers are: Animal Dander:  Some people are allergic to the flakes of skin or dried saliva from animals with fur or feathers.   There is no such thing as a hypoallergenic dog or cat breed. All  dogs or cats can cause allergies, even if they don't shed.  Keep these pets out of your home.  If you are not able to keep a pet outdoors, keep the pet out of your bedroom and other sleeping areas at all times, and keep the door closed.  Remove carpets and furniture covered with cloth from your home. If that is not possible, keep the pet away from fabric-covered furniture and  carpets. Dust Mites: Many people with asthma are allergic to dust mites. Dust mites are tiny bugs that are found in every home in mattresses, pillows, carpets, fabric-covered furniture, bedcovers, clothes, stuffed toys, and other fabric-covered items.   Cover your mattress in a special dust-proof cover.  Cover your pillow in a special dust-proof cover, or wash the pillow each week in hot water. Water must be hotter than 130 F (54.4 C) to kill dust mites. Cold or warm water used with detergent and bleach can also be effective.  Wash the sheets and blankets on your bed each week in hot water.  Try not to sleep or lie on cloth-covered cushions.  Call ahead when traveling and ask for a smoke-free hotel room. Bring your own bedding and pillows in case the hotel only supplies feather pillows and down comforters, which may contain dust mites and cause asthma symptoms.  Remove carpets from your bedroom and those laid on concrete, if you can.  Keep stuffed toys out of the bed, or wash the toys weekly in hot water or cooler water with detergent and bleach. Cockroaches: Many people with asthma are allergic to the droppings and remains of cockroaches.   Keep food and garbage in closed containers. Never leave food out.  Use poison baits, traps, powders, gels, or paste (for example, boric acid).  If a spray is used to kill cockroaches, stay out of the room until the odor goes away. Indoor Mold:  Fix leaky faucets, pipes, or other sources of water that have mold around them.  Clean floors and moldy surfaces with a fungicide or diluted bleach.  Avoid using humidifiers, vaporizers, or swamp coolers. These can spread molds through the air. Pollen and Outdoor Mold:  When pollen or mold spore counts are high, try to keep your windows closed.  Stay indoors with windows closed from late morning to afternoon. Pollen and some mold spore counts are highest at that time.  Ask your health care provider  whether you need to take anti-inflammatory medicine or increase your dose of the medicine before your allergy season starts. Other Irritants to Avoid:  Tobacco smoke is an irritant. If you smoke, ask your health care provider how you can quit. Ask family members to quit smoking too. Do not allow smoking in your home or car.  If possible, do not use a wood-burning stove, kerosene heater, or fireplace. Minimize exposure to all sources of smoke, including to incense, candles, fires, and fireworks.  Try to stay away from strong odors and sprays, such as perfume, talcum powder, hair spray, and paints.  Decrease humidity in your home and use an indoor air cleaning device. Reduce indoor humidity to below 60%. Dehumidifiers or central air conditioners can do this.  Decrease house dust exposure by changing furnace and air cooler filters frequently.  Try to have someone else vacuum for you once or twice a week. Stay out of rooms while they are being vacuumed and for a short while afterward.  If you vacuum, use a dust mask from a hardware store, a  double-layered or microfilter vacuum cleaner bag, or a vacuum cleaner with a HEPA filter.  Sulfites in foods and beverages can be irritants. Do not drink beer or wine or eat dried fruit, processed potatoes, or shrimp if they cause asthma symptoms.  Cold air can trigger an asthma attack. Cover your nose and mouth with a scarf on cold or windy days.  Several health conditions can make asthma more difficult to manage, including a runny nose, sinus infections, reflux disease, psychological stress, and sleep apnea. Work with your health care provider to manage these conditions.  Avoid close contact with people who have a respiratory infection such as a cold or the flu, since your asthma symptoms may get worse if you catch the infection. Wash your hands thoroughly after touching items that may have been handled by people with a respiratory infection.  Get a flu  shot every year to protect against the flu virus, which often makes asthma worse for days or weeks. Also get a pneumonia shot if you have not previously had one. Unlike the flu shot, the pneumonia shot does not need to be given yearly. Medicines:  Talk to your health care provider about whether it is safe for you to take aspirin or non-steroidal anti-inflammatory medicines (NSAIDs). In a small number of people with asthma, aspirin and NSAIDs can cause asthma attacks. These medicines must be avoided by people who have known aspirin-sensitive asthma. It is important that people with aspirin-sensitive asthma read labels of all over-the-counter medicines used to treat pain, colds, coughs, and fever.  Beta blockers and ACE inhibitors are other medicines you should discuss with your health care provider. HOW CAN I FIND OUT WHAT I AM ALLERGIC TO? Ask your asthma health care provider about allergy skin testing or blood testing (the RAST test) to identify the allergens to which you are sensitive. If you are found to have allergies, the most important thing to do is to try to avoid exposure to any allergens that you are sensitive to as much as possible. Other treatments for allergies, such as medicines and allergy shots (immunotherapy) are available.  CAN I EXERCISE? Follow your health care provider's advice regarding asthma treatment before exercising. It is important to maintain a regular exercise program, but vigorous exercise, or exercise in cold, humid, or dry environments can cause asthma attacks, especially for those people who have exercise-induced asthma. Document Released: 11/30/2009 Document Revised: 08/14/2013 Document Reviewed: 06/19/2013 [facility] Patient Information 2014 [place], [place].

## 2014-04-02 NOTE — ED Provider Notes (Signed)
CSN: [id number]     Arrival date & time 04/02/14  [id number] History   First MD Initiated Contact with Patient 04/02/14 [phone]     Chief Complaint  Patient presents with  . 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 Last night she started having worsening shortness of breath and wheezing. She has used her nebulizer during the night for a total of 10 mg of albuterol with no relief. She was seen in the ED on March 24 for cough and wheezing and was diagnosed with bronchitis. She states she still has a cough and she minimally has mucus at times it's clear. She denies rhinorrhea, sneezing, or sore throat. She does have some posttussive vomiting. She denies diarrhea. She states she is having a wheezing. Patient states she was smoking up to one pack a day however she has cut dramatically down over the last 2 weeks.  PCP Dr Caryl NeverBurchette  Past Medical History  Diagnosis Date  . Asthma   . Anxiety    Past Surgical History  Procedure Laterality Date  . Tubal ligation    . Bladder surgery  2010   Family History  Problem Relation Age of Onset  . Diabetes Paternal Grandfather    History  Substance Use Topics  . Smoking status: Current Every Day Smoker -- 1.00 packs/day for 7 years    Types: Cigarettes  . Smokeless tobacco: Not on file  . Alcohol Use: No   Employed Lives with spouse   OB History   Grav Para Term Preterm Abortions TAB SAB Ect Mult Living                 Review of Systems  All other systems reviewed and are negative.     Allergies  Ketorolac tromethamine and Hydromorphone hcl  Home Medications   Current Outpatient Rx  Name  Route  Sig  Dispense  Refill  . albuterol (PROVENTIL HFA;VENTOLIN HFA) 108 (90 BASE) MCG/ACT inhaler   Inhalation   Inhale 1-2 puffs into the lungs every 6 (six) hours as  needed for wheezing or shortness of breath.         . ALPRAZolam (XANAX) 0.5 MG tablet   Oral   Take 0.25 mg by mouth daily as needed for anxiety.         . chlordiazePOXIDE (LIBRIUM) 5 MG capsule   Oral   Take 1 capsule (5 mg total) by mouth 2 (two) times daily.   30 capsule   0   . diphenhydrAMINE (BENADRYL) 25 MG tablet   Oral   Take 25 mg by mouth as needed for itching.         . fluticasone (FLONASE) 50 MCG/ACT nasal spray   Each Nare   Place 2 sprays into both nostrils daily.         . Fluticasone-Salmeterol (ADVAIR DISKUS) 250-50 MCG/DOSE AEPB   Inhalation   Inhale 1 puff into the lungs 2 (two) times daily.         Marland Kitchen. ibuprofen (ADVIL,MOTRIN) 200 MG tablet   Oral   Take 800 mg by mouth every 6 (six) hours as needed for moderate pain.         Marland Kitchen. levalbuterol (XOPENEX HFA) 45 MCG/ACT inhaler   Inhalation   Inhale 1-2 puffs into the lungs every 4 (four) hours as needed for wheezing.         .Marland Kitchen  levalbuterol (XOPENEX) 1.25 MG/0.5ML nebulizer solution   Nebulization   Take 1.25 mg by nebulization every 4 (four) hours as needed for wheezing or shortness of breath.         . nicotine (NICODERM CQ - DOSED IN MG/24 HOURS) 21 mg/24hr patch   Transdermal   Place 21 mg onto the skin daily.          ED Triage Vitals  Enc Vitals Group     BP 04/02/14 0843 130/85 mmHg     Pulse Rate 04/02/14 0843 129     Resp 04/02/14 0843 30     Temp 04/02/14 0843 98.5 F (36.9 C)     Temp src 04/02/14 0843 Oral     SpO2 04/02/14 0843 96 %     Weight --      Height --      Head Cir --      Peak Flow --      Pain Score 04/02/14 0846 5     Pain Loc --      Pain Edu? --      Excl. in GC? --    Vital signs normal except tachycardia and tachypnea   Physical Exam  Nursing note and vitals reviewed. Constitutional: She is oriented to person, place, and time. She appears well-developed and well-nourished.  Non-toxic appearance. She does not appear ill. No distress.   HENT:  Head: Normocephalic and atraumatic.  Right Ear: External ear normal.  Left Ear: External ear normal.  Nose: Nose normal. No mucosal edema or rhinorrhea.  Mouth/Throat: Oropharynx is clear and moist and mucous membranes are normal. No dental abscesses or uvula swelling.  Eyes: Conjunctivae and EOM are normal. Pupils are equal, round, and reactive to light.  Neck: Normal range of motion and full passive range of motion without pain. Neck supple.  Cardiovascular: Regular rhythm and normal heart sounds.  Tachycardia present.  Exam reveals no gallop and no friction rub.   No murmur heard. Pulmonary/Chest: Accessory muscle usage present. Tachypnea noted. She is in respiratory distress. She has decreased breath sounds. She has wheezes. She has no rhonchi. She has no rales. She exhibits no tenderness and no crepitus.  Abdominal: Soft. Normal appearance and bowel sounds are normal. She exhibits no distension. There is no tenderness. There is no rebound and no guarding.  Musculoskeletal: Normal range of motion. She exhibits no edema and no tenderness.  Moves all extremities well.   Neurological: She is alert and oriented to person, place, and time. She has normal strength. No cranial nerve deficit.  Skin: Skin is warm, dry and intact. No rash noted. No erythema. No pallor.  Psychiatric: She has a normal mood and affect. Her speech is normal and behavior is normal. Her mood appears not anxious.    ED Course  Procedures (including critical care time)  Medications  0.9 %  sodium chloride infusion (0 mLs Intravenous Stopped 04/02/14 1041)    Followed by  0.9 %  sodium chloride infusion (0 mLs Intravenous Stopped 04/02/14 1300)  methylPREDNISolone sodium succinate (SOLU-MEDROL) 125 mg/2 mL injection 125 mg (125 mg Intravenous Given 04/02/14 0913)  albuterol (PROVENTIL,VENTOLIN) solution continuous neb (15 mg/hr Nebulization Given 04/02/14 0903)  ipratropium (ATROVENT) nebulizer solution 0.5 mg (0.5 mg  Nebulization Given 04/02/14 0903)  magnesium sulfate IVPB 2 g 50 mL (0 g Intravenous Stopped 04/02/14 1013)  albuterol (PROVENTIL) (2.5 MG/3ML) 0.083% nebulizer solution 5 mg (5 mg Nebulization Given 04/02/14 1146)  ipratropium (ATROVENT) nebulizer solution 0.5  mg (0.5 mg Nebulization Given 04/02/14 1146)    09:00 Review of patient's labs from March 24 shows her BUN was 5 and her creatinine was 0.65 so IV magnesium was ordered for this episode.  Recheck 11:30 pt is feeling better, still c/o feeling tight. Now has diffuse low pitched rhonchi diffusely. Discussed possible need for admission. Will try one more nebulizer tx.   Recheck 13:45 Pt is now clear. She was ambulated by nursing staff with pulse ox 96% on RA and didn't feel worse. Feels ready to be discharged.    Labs Review Results for orders placed during the hospital encounter of 04/02/14  CBC WITH DIFFERENTIAL      Result Value Ref Range   WBC 24.2 (*) 4.0 - 10.5 K/uL   RBC 4.86  3.87 - 5.11 MIL/uL   Hemoglobin 14.4  12.0 - 15.0 g/dL   HCT 16.1  09.6 - 04.5 %   MCV 87.2  78.0 - 100.0 fL   MCH 29.6  26.0 - 34.0 pg   MCHC 34.0  30.0 - 36.0 g/dL   RDW 40.9  81.1 - 91.4 %   Platelets 391  150 - 400 K/uL   Neutrophils Relative % 87 (*) 43 - 77 %   Neutro Abs 21.2 (*) 1.7 - 7.7 K/uL   Lymphocytes Relative 7 (*) 12 - 46 %   Lymphs Abs 1.8  0.7 - 4.0 K/uL   Monocytes Relative 4  3 - 12 %   Monocytes Absolute 0.9  0.1 - 1.0 K/uL   Eosinophils Relative 1  0 - 5 %   Eosinophils Absolute 0.3  0.0 - 0.7 K/uL   Basophils Relative 0  0 - 1 %   Basophils Absolute 0.0  0.0 - 0.1 K/uL  I-STAT CHEM 8, ED      Result Value Ref Range   Sodium 141  137 - 147 mEq/L   Potassium 3.8  3.7 - 5.3 mEq/L   Chloride 107  96 - 112 mEq/L   BUN 8  6 - 23 mg/dL   Creatinine, Ser 7.82  0.50 - 1.10 mg/dL   Glucose, Bld 956 (*) 70 - 99 mg/dL   Calcium, Ion 2.13  0.86 - 1.23 mmol/L   TCO2 20  0 - 100 mmol/L   Hemoglobin 15.3 (*) 12.0 - 15.0 g/dL   HCT 57.8   46.9 - 62.9 %    Laboratory interpretation all normal except marked increase of her chronic leukocytosis   Imaging Review Dg Chest 2 View  04/02/2014   CLINICAL DATA:  SHORTNESS OF BREATH  EXAM: CHEST  2 VIEW  COMPARISON:  DG CHEST 2 VIEW dated 03/18/2014  FINDINGS: Low lung volumes. Cardiomediastinal silhouette unremarkable. Persistent mild prominence of the interstitial markings slightly more conspicuous. No focal regions of consolidation nor focal infiltrates. No acute osseous abnormalities.  IMPRESSION: Slight increase conspicuity of the interstitial findings may reflect sequela bronchitis. No focal regions of consolidation or focal infiltrates.   Electronically Signed   By: Salome Holmes M.D.   On: 04/02/2014 12:44     EKG Interpretation   Date/Time:  Wednesday April 02 2014 08:44:29 EDT Ventricular Rate:  130 PR Interval:  129 QRS Duration: 93 QT Interval:  301 QTC Calculation: 443 R Axis:   90 Text Interpretation:  Sinus tachycardia Consider right atrial enlargement  Borderline right axis deviation Abnormal T, consider ischemia, diffuse  leads No significant change since last tracing Confirmed by Nysha Koplin  MD-I,  Quenten Nawaz ([id number])  on 04/02/2014 8:47:07 AM      MDM   Final diagnoses:  Asthma exacerbation  Bronchitis    New Prescriptions   ALBUTEROL (PROVENTIL HFA;VENTOLIN HFA) 108 (90 BASE) MCG/ACT INHALER    Inhale 2 puffs into the lungs every 4 (four) hours as needed for wheezing or shortness of breath.   DOXYCYCLINE (VIBRAMYCIN) 100 MG CAPSULE    Take 1 capsule (100 mg total) by mouth 2 (two) times daily.   PREDNISONE (DELTASONE) 20 MG TABLET    Take 3 po QD x 2d starting tomorrow, then 2 po QD x 3d then 1 po QD x 3d    Plan discharge  Devoria Albe, MD, Franz Dell, MD 04/02/14 [phone]

## 2014-04-02 NOTE — ED Notes (Signed)
Pt reports being diagnosed with bronchitis month ago, got better until yesterday when she worked in dusty environment. Pt reports sob since last night. Pt speaking in short sentences in whisper. Pt has hx of anxiety and asthma. Reports taking several home nebulizer treatments, 10mg  albuterol and 0.5 atrovent.

## 2014-04-02 NOTE — ED Notes (Signed)
RT at bedside.

## 2014-04-02 NOTE — ED Notes (Signed)
97% on RA when ambulating, HR 132-MD aware-no distress noted with ambulation

## 2014-04-22 ENCOUNTER — Other Ambulatory Visit: Payer: Self-pay | Admitting: Internal Medicine

## 2014-05-13 ENCOUNTER — Ambulatory Visit (INDEPENDENT_AMBULATORY_CARE_PROVIDER_SITE_OTHER): Payer: 59 | Admitting: Internal Medicine

## 2014-05-13 ENCOUNTER — Encounter: Payer: Self-pay | Admitting: Internal Medicine

## 2014-05-13 VITALS — BP 110/80 | HR 90 | Temp 99.7°F

## 2014-05-13 DIAGNOSIS — J45901 Unspecified asthma with (acute) exacerbation: Secondary | ICD-10-CM

## 2014-05-13 DIAGNOSIS — F17200 Nicotine dependence, unspecified, uncomplicated: Secondary | ICD-10-CM

## 2014-05-13 DIAGNOSIS — J988 Other specified respiratory disorders: Secondary | ICD-10-CM

## 2014-05-13 DIAGNOSIS — J22 Unspecified acute lower respiratory infection: Secondary | ICD-10-CM

## 2014-05-13 DIAGNOSIS — Z720 Tobacco use: Secondary | ICD-10-CM

## 2014-05-13 MED ORDER — IPRATROPIUM-ALBUTEROL 0.5-2.5 (3) MG/3ML IN SOLN: 3.0000 mL | Freq: Four times a day (QID) | RESPIRATORY_TRACT | Status: DC | PRN

## 2014-05-13 MED ORDER — PREDNISONE 20 MG PO TABS: ORAL_TABLET | ORAL | Status: DC

## 2014-05-13 MED ORDER — AZITHROMYCIN 250 MG PO TABS: 250.0000 mg | ORAL_TABLET | ORAL | Status: DC

## 2014-05-13 NOTE — Progress Notes (Signed)
Pre visit review using our clinic review tool, if applicable. No additional management support is needed unless otherwise documented below in the visit note.   Chief Complaint  Patient presents with  . Cough    Started on Sunday.    . Sore Throat  . Sinus Pressure  . Chest Tightness    HPI: Patient comes in today for SDA for  new problem evaluation. Onset 3 -4 days of above sx and feels asthma os getting worse  pcp na today Hx of asthma  And  Gets bad quickly  Face and head hurts.   Onset like allergies bu then fever and whole hh was ill  Baseline advair once a day ans xopenex.  Uses inhaler  More advair now bid  And then interim albuterol . 2 night.   recently  This year hx of hosp with flu and pna this year  Still tobacco but using patch night and down to 5-6 per day.. Usually reponds to antibiotic and steroid   ROS: See pertinent positives and negatives per HPI. No nvd fever chills currently   Past Medical History  Diagnosis Date  . Asthma   . Anxiety     Family History  Problem Relation Age of Onset  . Diabetes Paternal Grandfather     History   Social History  . Marital Status: Married    Spouse Name: N/A    Number of Children: N/A  . Years of Education: N/A   Social History Main Topics  . Smoking status: Current Every Day Smoker -- 1.00 packs/day for 7 years    Types: Cigarettes  . Smokeless tobacco: None  . Alcohol Use: No  . Drug Use: None  . Sexual Activity: None   Other Topics Concern  . None   Social History Narrative  . None    Outpatient Encounter Prescriptions as of 05/13/2014  Medication Sig  . ADVAIR DISKUS 250-50 MCG/DOSE AEPB INHALE 1 PUFF INTO THE LUNGS AT BEDTIME  . albuterol (PROVENTIL HFA;VENTOLIN HFA) 108 (90 BASE) MCG/ACT inhaler Inhale 2 puffs into the lungs every 4 (four) hours as needed for wheezing or shortness of breath.  . ALPRAZolam (XANAX) 0.5 MG tablet Take 0.25 mg by mouth daily as needed for anxiety.  . chlordiazePOXIDE  (LIBRIUM) 5 MG capsule Take 1 capsule (5 mg total) by mouth 2 (two) times daily.  . diphenhydrAMINE (BENADRYL) 25 MG tablet Take 25 mg by mouth as needed for itching.  . fluticasone (FLONASE) 50 MCG/ACT nasal spray Place 2 sprays into both nostrils daily.  . Fluticasone-Salmeterol (ADVAIR DISKUS) 250-50 MCG/DOSE AEPB Inhale 1 puff into the lungs 2 (two) times daily.  Marland Kitchen. ibuprofen (ADVIL,MOTRIN) 200 MG tablet Take 800 mg by mouth every 6 (six) hours as needed for moderate pain.  Marland Kitchen. levalbuterol (XOPENEX HFA) 45 MCG/ACT inhaler Inhale 1-2 puffs into the lungs every 4 (four) hours as needed for wheezing.  . levalbuterol (XOPENEX) 1.25 MG/0.5ML nebulizer solution Take 1.25 mg by nebulization every 4 (four) hours as needed for wheezing or shortness of breath.  . nicotine (NICODERM CQ - DOSED IN MG/24 HOURS) 21 mg/24hr patch Place 21 mg onto the skin daily.  Marland Kitchen. albuterol (PROVENTIL HFA;VENTOLIN HFA) 108 (90 BASE) MCG/ACT inhaler Inhale 1-2 puffs into the lungs every 6 (six) hours as needed for wheezing or shortness of breath.  Marland Kitchen. azithromycin (ZITHROMAX Z-PAK) 250 MG tablet Take 1 tablet (250 mg total) by mouth as directed. Take 2 po first day, then 1 po qd  .  ipratropium-albuterol (DUONEB) 0.5-2.5 (3) MG/3ML SOLN Take 3 mLs by nebulization every 6 (six) hours as needed.  . predniSONE (DELTASONE) 20 MG tablet Take 3 po qd for 2 days then 2 po qd for 3 days,or as directed  . [DISCONTINUED] doxycycline (VIBRAMYCIN) 100 MG capsule Take 1 capsule (100 mg total) by mouth 2 (two) times daily.  . [DISCONTINUED] predniSONE (DELTASONE) 20 MG tablet Take 3 po QD x 2d starting tomorrow, then 2 po QD x 3d then 1 po QD x 3d    EXAM:  BP 110/80  Pulse 90  Temp(Src) 99.7 F (37.6 C) (Oral)  Body mass index is 0.00 kg/(m^2). WDWN in NAD  quiet respirations; mildly congested  somewhat hoarse. Non toxic . Deep tight cough good color  HEENT: Normocephalic ;atraumatic , Eyes;  PERRL, EOMs  Full, lids and conjunctiva  clear,,Ears: no deformities, canals nl, TM landmarks normal, Nose: no deformity clear  discharge but congested;face upper cheek tender Mouth : OP clear without lesion or edema . Neck: Supple without adenopathy or masses or bruits Chest:   ticht dec bs ocass wheeze no rales rhonchi  CV:  S1-S2 no gallops or murmurs peripheral perfusion is normal Skin :nl perfusion and no acute rashes  PSYCH: pleasant and cooperative, no obvious depression or anxiety  ASSESSMENT AND PLAN:  Discussed the following assessment and plan:  Asthma with acute exacerbation - increasing steroid burst ( consider inje but gets se ) duoneb samples given 5 rx if needed  Acute respiratory infection - poss viral can add antibiotic risk benenfit equal  Tobacco abuse - trying to quit  strategies reviewed  Close fu warranted  Disc risk and fu  Needs better asthma control  But she is aware of necessity of tob free state  -Patient advised to return or notify health care team  if symptoms worsen ,persist or new concerns arise.  Patient Instructions  Take the prednisone immediatly.  duobneb max is every 6 hours . If needed.  Expect to improve within 48 hours .  Fu or contactus if not.     Neta MendsWanda K. Panosh M.D.

## 2014-05-13 NOTE — Patient Instructions (Signed)
Take the prednisone immediatly.  duobneb max is every 6 hours . If needed.  Expect to improve within 48 hours .  Fu or contactus if not.

## 2014-05-16 ENCOUNTER — Telehealth: Payer: Self-pay | Admitting: Family Medicine

## 2014-05-16 MED ORDER — ALBUTEROL SULFATE HFA 108 (90 BASE) MCG/ACT IN AERS: 2.0000 | INHALATION_SPRAY | RESPIRATORY_TRACT | Status: DC | PRN

## 2014-05-16 MED ORDER — IPRATROPIUM-ALBUTEROL 0.5-2.5 (3) MG/3ML IN SOLN: 3.0000 mL | Freq: Four times a day (QID) | RESPIRATORY_TRACT | Status: DC | PRN

## 2014-05-16 MED ORDER — PREDNISONE 20 MG PO TABS: 20.0000 mg | ORAL_TABLET | Freq: Two times a day (BID) | ORAL | Status: DC

## 2014-05-16 NOTE — Telephone Encounter (Signed)
rx duoneb disp 1 box ? pred 20 mg bid for 5 days disp 10  rx generic albuterol hfa disp 1 only

## 2014-05-16 NOTE — Telephone Encounter (Signed)
Pt was seen on Tuesday, by dr. Fabian Sharp informed her to let her know if she need more predniSONE (DELTASONE) 20 MG tablet and the ipratropium-albuterol (duoneb) 0.5-2.5 (3) mg/103ml soln, pt states she does need more, states she is still a little congested. Also pt needs new rx for albuterol (proventil)-hfa, ventolin hfa 108 (90 base) mcg/act inhaler send to [facility].

## 2014-05-16 NOTE — Telephone Encounter (Signed)
Left a message on patient's personally identified cell informing her to pick up the prescriptions at the pharmacy.  Call back if any questions.

## 2014-06-04 ENCOUNTER — Other Ambulatory Visit: Payer: Self-pay | Admitting: Internal Medicine

## 2014-06-23 ENCOUNTER — Telehealth: Payer: Self-pay | Admitting: Family Medicine

## 2014-06-23 NOTE — Telephone Encounter (Signed)
Pt would like a rx for prednisone. Pt states her child got a cold now she has one also. Pt just in office 5/19. And saw dr Fabian Sharppanosh for sinus inf. Would like something asap if possible [facility] [address]

## 2014-06-23 NOTE — Telephone Encounter (Signed)
Pt stated that her symptoms are a cough congestion no fever.

## 2014-06-24 MED ORDER — PREDNISONE 10 MG PO TABS: ORAL_TABLET | ORAL | Status: DC

## 2014-06-24 NOTE — Telephone Encounter (Signed)
Rx sent to pharmacy and Pt is aware 

## 2014-06-24 NOTE — Telephone Encounter (Signed)
May taper prednisone 10 mg : 6-5-4-3-2-1 but needs  Follow up if no better in one week

## 2014-07-23 ENCOUNTER — Other Ambulatory Visit: Payer: Self-pay | Admitting: Internal Medicine

## 2014-08-14 ENCOUNTER — Other Ambulatory Visit: Payer: Self-pay | Admitting: Family Medicine

## 2014-08-21 ENCOUNTER — Other Ambulatory Visit: Payer: Self-pay | Admitting: Family Medicine

## 2014-09-11 ENCOUNTER — Encounter: Payer: Self-pay | Admitting: Family Medicine

## 2014-09-11 ENCOUNTER — Ambulatory Visit (INDEPENDENT_AMBULATORY_CARE_PROVIDER_SITE_OTHER): Payer: 59 | Admitting: Family Medicine

## 2014-09-11 VITALS — BP 112/74 | HR 128 | Temp 98.6°F | Wt 195.0 lb

## 2014-09-11 DIAGNOSIS — J45901 Unspecified asthma with (acute) exacerbation: Secondary | ICD-10-CM

## 2014-09-11 DIAGNOSIS — J4541 Moderate persistent asthma with (acute) exacerbation: Secondary | ICD-10-CM

## 2014-09-11 MED ORDER — PREDNISONE 10 MG PO TABS: ORAL_TABLET | ORAL | Status: DC

## 2014-09-11 MED ORDER — AZITHROMYCIN 250 MG PO TABS: ORAL_TABLET | ORAL | Status: AC

## 2014-09-11 NOTE — Progress Notes (Signed)
Pre visit review using our clinic review tool, if applicable. No additional management support is needed unless otherwise documented below in the visit note. 

## 2014-09-11 NOTE — Progress Notes (Signed)
   Subjective:    Patient ID: Amy [name], female    DOB: [birth year], 38 y.o.   MRN: [id number]  Cough Associated symptoms include headaches and wheezing. Pertinent negatives include no chest pain, chills, fever or shortness of breath.   Acute visit for cough. Onset about a week ago. Thick yellow sputum. She does smoke and also history of asthma. She's had some wheezing off and on. Uses rescue inhaler occasionally. Uses Advair once daily and recently increased to twice daily. She is fairly sure she had fever (initially) but did not actually take her temperature. She's had some headache and nasal congestion as well.  Past Medical History  Diagnosis Date  . Asthma   . Anxiety    Past Surgical History  Procedure Laterality Date  . Tubal ligation    . Bladder surgery  2010    reports that she has been smoking Cigarettes.  She has a 7 pack-year smoking history. She does not have any smokeless tobacco history on file. She reports that she does not drink alcohol. Her drug history is not on file. family history includes Diabetes in her paternal grandfather. Allergies  Allergen Reactions  . Ketorolac Tromethamine Hives and Itching  . Hydromorphone Hcl Itching and Anxiety    No opioids.      Review of Systems  Constitutional: Negative for fever and chills.  HENT: Positive for congestion.   Respiratory: Positive for cough and wheezing. Negative for shortness of breath.   Cardiovascular: Negative for chest pain.  Neurological: Positive for headaches.       Objective:   Physical Exam  Constitutional: She appears well-developed and well-nourished. No distress.  HENT:  Right Ear: External ear normal.  Left Ear: External ear normal.  Mouth/Throat: Oropharynx is clear and moist.  Neck: Neck supple.  Cardiovascular: Normal rate and regular rhythm.   Pulmonary/Chest: Effort normal. No respiratory distress. She has wheezes. She has no rales.  Musculoskeletal: She exhibits no edema.    Lymphadenopathy:    She has no cervical adenopathy.          Assessment & Plan:  Acute upper respiratory illness with asthma exacerbation.  No distress.  Prednisone taper.  Continue with usual inhalers.  Zithromax for 5 days.  Follow up PRN. Flu vaccine after over acute illness.

## 2014-09-11 NOTE — Patient Instructions (Signed)

## 2014-10-02 ENCOUNTER — Other Ambulatory Visit: Payer: Self-pay | Admitting: Family Medicine

## 2014-10-06 ENCOUNTER — Encounter (HOSPITAL_COMMUNITY): Payer: Self-pay | Admitting: Emergency Medicine

## 2014-10-06 ENCOUNTER — Emergency Department (HOSPITAL_COMMUNITY): Payer: 59

## 2014-10-06 ENCOUNTER — Emergency Department (HOSPITAL_COMMUNITY)
Admission: EM | Admit: 2014-10-06 | Discharge: 2014-10-06 | Disposition: A | Discharge status: Home / Self Care | Payer: 59 | Attending: Emergency Medicine | Admitting: Emergency Medicine

## 2014-10-06 DIAGNOSIS — Z79899 Other long term (current) drug therapy: Secondary | ICD-10-CM | POA: Insufficient documentation

## 2014-10-06 DIAGNOSIS — F419 Anxiety disorder, unspecified: Secondary | ICD-10-CM | POA: Insufficient documentation

## 2014-10-06 DIAGNOSIS — Z720 Tobacco use: Secondary | ICD-10-CM | POA: Diagnosis not present

## 2014-10-06 DIAGNOSIS — R0682 Tachypnea, not elsewhere classified: Secondary | ICD-10-CM | POA: Diagnosis not present

## 2014-10-06 DIAGNOSIS — R000 Tachycardia, unspecified: Secondary | ICD-10-CM | POA: Diagnosis not present

## 2014-10-06 DIAGNOSIS — J45901 Unspecified asthma with (acute) exacerbation: Secondary | ICD-10-CM | POA: Insufficient documentation

## 2014-10-06 LAB — BASIC METABOLIC PANEL
ANION GAP: 16 — AB (ref 5–15)
BUN: 9 mg/dL (ref 6–23)
CALCIUM: 9.3 mg/dL (ref 8.4–10.5)
CHLORIDE: 101 meq/L (ref 96–112)
CO2: 22 mEq/L (ref 19–32)
Creatinine, Ser: 0.87 mg/dL (ref 0.50–1.10)
GFR calc Af Amer: >90 mL/min (ref >90)
GFR calc non Af Amer: 85 mL/min — ABNORMAL LOW (ref >90)
Glucose, Bld: 102 mg/dL — ABNORMAL HIGH (ref 70–99)
Potassium: 4.5 mEq/L (ref 3.7–5.3)
SODIUM: 139 meq/L (ref 137–147)

## 2014-10-06 LAB — CBC WITH DIFFERENTIAL/PLATELET
BASOS ABS: 0 10*3/uL (ref 0.0–0.1)
Basophils Relative: 0 % (ref 0–1)
EOS PCT: 2 % (ref 0–5)
Eosinophils Absolute: 0.4 10*3/uL (ref 0.0–0.7)
HEMATOCRIT: 42.8 % (ref 36.0–46.0)
Hemoglobin: 14.6 g/dL (ref 12.0–15.0)
LYMPHS PCT: 8 % — AB (ref 12–46)
Lymphs Abs: 1.7 10*3/uL (ref 0.7–4.0)
MCH: 30.4 pg (ref 26.0–34.0)
MCHC: 34.1 g/dL (ref 30.0–36.0)
MCV: 89 fL (ref 78.0–100.0)
Monocytes Absolute: 0.8 10*3/uL (ref 0.1–1.0)
Monocytes Relative: 4 % (ref 3–12)
NEUTROS ABS: 18.3 10*3/uL — AB (ref 1.7–7.7)
Neutrophils Relative %: 86 % — ABNORMAL HIGH (ref 43–77)
PLATELETS: 384 10*3/uL (ref 150–400)
RBC: 4.81 MIL/uL (ref 3.87–5.11)
RDW: 12.9 % (ref 11.5–15.5)
WBC: 21.2 10*3/uL — ABNORMAL HIGH (ref 4.0–10.5)

## 2014-10-06 LAB — D-DIMER, QUANTITATIVE: D-Dimer, Quant: 0.4 ug/mL-FEU (ref 0.00–0.48)

## 2014-10-06 MED ORDER — ALBUTEROL SULFATE (2.5 MG/3ML) 0.083% IN NEBU
10.0000 mg | INHALATION_SOLUTION | Freq: Once | RESPIRATORY_TRACT | Status: DC
  Filled 2014-10-06: qty 12

## 2014-10-06 MED ORDER — ALBUTEROL (5 MG/ML) CONTINUOUS INHALATION SOLN
10.0000 mg/h | INHALATION_SOLUTION | RESPIRATORY_TRACT | Status: AC
  Administered 2014-10-06: 10 mg/h via RESPIRATORY_TRACT

## 2014-10-06 MED ORDER — SODIUM CHLORIDE 0.9 % IV BOLUS (SEPSIS)
1000.0000 mL | Freq: Once | INTRAVENOUS | Status: AC
  Administered 2014-10-06: 1000 mL via INTRAVENOUS

## 2014-10-06 MED ORDER — PREDNISONE 10 MG PO TABS: 60.0000 mg | ORAL_TABLET | Freq: Every day | ORAL | Status: DC

## 2014-10-06 MED ORDER — IPRATROPIUM BROMIDE 0.02 % IN SOLN
0.5000 mg | Freq: Once | RESPIRATORY_TRACT | Status: AC
  Administered 2014-10-06: 0.5 mg via RESPIRATORY_TRACT
  Filled 2014-10-06: qty 2.5

## 2014-10-06 MED ORDER — ALBUTEROL SULFATE HFA 108 (90 BASE) MCG/ACT IN AERS: 2.0000 | INHALATION_SPRAY | RESPIRATORY_TRACT | Status: DC | PRN

## 2014-10-06 MED ORDER — SODIUM CHLORIDE 0.9 % IV SOLN
INTRAVENOUS | Status: DC
  Administered 2014-10-06: 09:00:00 via INTRAVENOUS

## 2014-10-06 MED ORDER — MAGNESIUM SULFATE 40 MG/ML IJ SOLN
2.0000 g | Freq: Once | INTRAMUSCULAR | Status: AC
  Administered 2014-10-06: 2 g via INTRAVENOUS
  Filled 2014-10-06: qty 50

## 2014-10-06 MED ORDER — LORAZEPAM 2 MG/ML IJ SOLN
1.0000 mg | Freq: Once | INTRAMUSCULAR | Status: AC
  Administered 2014-10-06: 07:00:00 via INTRAVENOUS
  Filled 2014-10-06: qty 1

## 2014-10-06 MED ORDER — METHYLPREDNISOLONE SODIUM SUCC 125 MG IJ SOLR
125.0000 mg | Freq: Once | INTRAMUSCULAR | Status: AC
  Administered 2014-10-06: 125 mg via INTRAVENOUS
  Filled 2014-10-06: qty 2

## 2014-10-06 NOTE — ED Notes (Signed)
Pt is more relaxed ,  Breathing is better than upon arrival,  Respiratory rate within normal limits at this time and heart rate has lowered

## 2014-10-06 NOTE — ED Notes (Signed)
Patient transported to X-ray 

## 2014-10-06 NOTE — ED Notes (Signed)
Elevated lactic acid given to Dr Roselyn BeringKnapp,j...klj

## 2014-10-06 NOTE — ED Notes (Addendum)
RT paged, pt back from xray

## 2014-10-06 NOTE — Discharge Instructions (Signed)

## 2014-10-06 NOTE — ED Provider Notes (Signed)
CSN: [id number]     Arrival date & time 10/06/14  0606 History   First MD Initiated Contact with Patient 10/06/14 [phone]     Chief Complaint  Patient presents with  . Asthma      The history is provided by the patient.   patient presents complaining of asthma exacerbation the past 2 nights.  She states this feels like prior asthma exacerbations.  She's tried 5 do you have meds at home without improvement in her symptoms and presents requesting assistance.  She also reports that she has some anxiety and has not been on her anxiety medications lately.  She does report that sometimes her anxiety exacerbates her asthma.  She feels as though this asthma exacerbation was initially brought on by cleaning the house.  Denies cough or congestion or other upper respiratory symptoms.  No fevers or chills.  No history of DVT or pulmonary embolism.  No recent long travel or surgery.  Denies unilateral leg swelling.  Denies orthopnea.  No history of congestive heart failure.  Prior to my evaluation the patient was given Ativan by the ED physician of record at time of presentation.  She also had labs and x-rays ordered.  She states the anxiety medicine helped a little bit but she still feel short of breath.   Past Medical History  Diagnosis Date  . Asthma   . Anxiety    Past Surgical History  Procedure Laterality Date  . Tubal ligation    . Bladder surgery  2010   Family History  Problem Relation Age of Onset  . Diabetes Paternal Grandfather    History  Substance Use Topics  . Smoking status: Current Every Day Smoker -- 1.00 packs/day for 7 years    Types: Cigarettes  . Smokeless tobacco: Not on file  . Alcohol Use: No   OB History   Grav Para Term Preterm Abortions TAB SAB Ect Mult Living                 Review of Systems  All other systems reviewed and are negative.     Allergies  Ketorolac tromethamine and Hydromorphone hcl  Home Medications   Prior to Admission medications    Medication Sig Start Date End Date Taking? Authorizing Provider  ADVAIR DISKUS 250-50 MCG/DOSE AEPB INHALE 1 PUFF INTO THE LUNGS AT BEDTIME 08/14/14  Yes Kristian CoveyBruce W Burchette, MD  albuterol (PROVENTIL HFA;VENTOLIN HFA) 108 (90 BASE) MCG/ACT inhaler Inhale 1-2 puffs into the lungs every 6 (six) hours as needed for wheezing or shortness of breath.   Yes Historical Provider, MD  ALPRAZolam Prudy Feeler(XANAX) 0.5 MG tablet Take 0.25 mg by mouth daily as needed for anxiety.   Yes Historical Provider, MD  ibuprofen (ADVIL,MOTRIN) 200 MG tablet Take 800 mg by mouth every 6 (six) hours as needed for moderate pain.   Yes Historical Provider, MD  levalbuterol [facility](XOPENEX HFA) 45 MCG/ACT inhaler Inhale 1-2 puffs into the lungs every 4 (four) hours as needed for wheezing.   Yes Historical Provider, MD   BP 147/75  Pulse 129  Temp(Src) 97.5 F (36.4 C) (Oral)  Resp 32  SpO2 94%  LMP 09/22/2014 Physical Exam  Nursing note and vitals reviewed. Constitutional: She is oriented to person, place, and time. She appears well-developed and well-nourished. No distress.  HENT:  Head: Normocephalic and atraumatic.  Eyes: EOM are normal.  Neck: Normal range of motion.  Cardiovascular: Regular rhythm and normal heart sounds.  Tachycardia present.   Pulmonary/Chest: No  accessory muscle usage. Tachypnea noted. No respiratory distress. She has decreased breath sounds in the right lower field and the left lower field. She has wheezes in the right upper field and the left upper field. She has no rhonchi. She has no rales.  Abdominal: Soft. She exhibits no distension. There is no tenderness.  Musculoskeletal: Normal range of motion.  Neurological: She is alert and oriented to person, place, and time.  Skin: Skin is warm and dry.  Psychiatric: She has a normal mood and affect. Judgment normal.    ED Course  Procedures (including critical care time) Labs Review Labs Reviewed  CBC WITH DIFFERENTIAL - Abnormal; Notable for the  following:    WBC 21.2 (*)    Neutrophils Relative % 86 (*)    Neutro Abs 18.3 (*)    Lymphocytes Relative 8 (*)    All other components within normal limits  BASIC METABOLIC PANEL - Abnormal; Notable for the following:    Glucose, Bld 102 (*)    GFR calc non Af Amer 85 (*)    Anion gap 16 (*)    All other components within normal limits  D-DIMER, QUANTITATIVE    Imaging Review Dg Chest 2 View  10/06/2014   CLINICAL DATA:  Worsening aeration.  Asthma.  EXAM: CHEST  2 VIEW  COMPARISON:  04/02/2014.  FINDINGS: Prominent interstitial markings without hyperinflation similar to priors, could reflect chronic pneumonia or changes of reactive airways disease. No focal infiltrates, effusion, or pneumothorax. Negative osseous structures. Similar appearance to priors.  IMPRESSION: Prominent interstitial markings without hyperinflation. No focal consolidation.   Electronically Signed   By: Davonna BellingJohn  Curnes M.D.   On: 10/06/2014 07:26  I personally reviewed the imaging tests through PACS system I reviewed available ER/hospitalization records through the EMR   ECG: see other note   MDM   Final diagnoses:  Asthma exacerbation    Patient be given 10 mg albuterol at this time.  D-dimer will be added given her persistent tachycardia although this is likely secondary to albuterol.  Magnesium to be ordered.  We'll continue to monitor the patient closely.  She has decreased breath sounds in her bases and not significant wheezing at this time however I suspect it is because she is very tight and will need increased bronchodilation  11:35 AM Patient feels much better at this time.  She'll likely go home.  Home with schedule albuterol and steroids.  She understands return to the ER for new or worsening symptoms.  Still with tachycardia in the 120s this is all likely secondary to her progress dilators.    Lyanne CoKevin M Abbigael Detlefsen, MD 10/06/14 1136

## 2014-10-06 NOTE — ED Notes (Signed)
Pt states that she has been having trouble with her asthma for 2 nights; pt states that it got worse this am; pt states that she has had 5 duonebs at home with no improvement; pt leaning over triage chair and states "I just can't breathe"; pt also with anxiety and has not anxiety medications

## 2014-10-06 NOTE — ED Notes (Signed)
RT at bedside.

## 2014-10-06 NOTE — ED Provider Notes (Signed)
MSE was initiated and I personally evaluated the patient and placed orders (if any) at  6:41 AM on October 06, 2014.  Patient here complaining of asthma exacerbation x2 nights. She has used 5 of her albuterol treatments prior to arrival. She also notes increased anxiety. IV fluids and basic labs along with x-ray ordered. Patient given Solu-Medrol as well along with Ativan for her increased anxiety.   Date: 10/06/2014  Rate: 143  Rhythm: sinus tachycardia  QRS Axis: normal  Intervals: normal  ST/T Wave abnormalities: nonspecific ST changes  Conduction Disutrbances:none  Narrative Interpretation:   Old EKG Reviewed: none available    The patient appears stable so that the remainder of the MSE may be completed by another provider.  Toy BakerAnthony T Malon Branton, MD 10/06/14 [phone]

## 2014-10-06 NOTE — ED Notes (Signed)
Pt is in tripod position,  Upon initial examination.  Respiratory rate is 38 and heart rate is 148

## 2014-10-14 ENCOUNTER — Telehealth: Payer: Self-pay | Admitting: Family Medicine

## 2014-10-14 NOTE — Telephone Encounter (Signed)
Pt needs refills on Alprazolam and Xopenex please call when ready, Ok to leave a message.

## 2014-10-14 NOTE — Telephone Encounter (Signed)
Pt uses [facility] on [address] and would like a 60 DS is possible.

## 2014-10-15 MED ORDER — ALPRAZOLAM 0.5 MG PO TABS: 0.2500 mg | ORAL_TABLET | Freq: Every day | ORAL | Status: DC | PRN

## 2014-10-15 MED ORDER — LEVALBUTEROL TARTRATE 45 MCG/ACT IN AERO
1.0000 | INHALATION_SPRAY | RESPIRATORY_TRACT | Status: DC | PRN
Start: 2014-10-15 — End: 2015-05-29

## 2014-10-15 NOTE — Telephone Encounter (Signed)
Refill Xopenex x3 May refill Xanax #30 tablets. Avoid regular use

## 2014-10-15 NOTE — Telephone Encounter (Signed)
Rx sent to pharmacy   

## 2014-10-15 NOTE — Telephone Encounter (Signed)
Xanax How many do you want to dispense?  Last visit 09/11/14

## 2014-10-22 ENCOUNTER — Other Ambulatory Visit: Payer: Self-pay | Admitting: Family Medicine

## 2014-12-01 ENCOUNTER — Ambulatory Visit (INDEPENDENT_AMBULATORY_CARE_PROVIDER_SITE_OTHER): Payer: 59

## 2014-12-01 ENCOUNTER — Encounter: Payer: Self-pay | Admitting: Family Medicine

## 2014-12-01 ENCOUNTER — Ambulatory Visit (INDEPENDENT_AMBULATORY_CARE_PROVIDER_SITE_OTHER): Payer: 59 | Admitting: Family Medicine

## 2014-12-01 VITALS — BP 124/80 | HR 104 | Temp 98.4°F | Wt 189.0 lb

## 2014-12-01 DIAGNOSIS — Z23 Encounter for immunization: Secondary | ICD-10-CM

## 2014-12-01 DIAGNOSIS — F411 Generalized anxiety disorder: Secondary | ICD-10-CM

## 2014-12-01 MED ORDER — CITALOPRAM HYDROBROMIDE 20 MG PO TABS: 20.0000 mg | ORAL_TABLET | Freq: Every day | ORAL | Status: DC

## 2014-12-01 NOTE — Progress Notes (Signed)
Pre visit review using our clinic review tool, if applicable. No additional management support is needed unless otherwise documented below in the visit note. 
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
## 2014-12-02 ENCOUNTER — Telehealth: Payer: Self-pay | Admitting: Family Medicine

## 2014-12-02 NOTE — Telephone Encounter (Signed)
emmi mailed  °

## 2014-12-13 ENCOUNTER — Other Ambulatory Visit: Payer: Self-pay | Admitting: Family Medicine

## 2014-12-15 NOTE — Telephone Encounter (Signed)
Refill Duoneb.  Make sure that she has started the Citalopram. May refill #20 Alprazolam but should be taking very sparingly for severe anxiety and until we can try to get her symptoms under better control with SSRI med.

## 2014-12-15 NOTE — Telephone Encounter (Signed)
Last visit 12/01/14 Last refill 10/15/14 #30 0 refill

## 2015-01-27 ENCOUNTER — Encounter: Payer: Self-pay | Admitting: Family Medicine

## 2015-01-27 ENCOUNTER — Ambulatory Visit (INDEPENDENT_AMBULATORY_CARE_PROVIDER_SITE_OTHER): Payer: 59 | Admitting: Family Medicine

## 2015-01-27 VITALS — BP 102/70 | HR 112 | Temp 98.2°F | Ht 62.0 in | Wt 182.0 lb

## 2015-01-27 DIAGNOSIS — Z720 Tobacco use: Secondary | ICD-10-CM

## 2015-01-27 DIAGNOSIS — J069 Acute upper respiratory infection, unspecified: Secondary | ICD-10-CM

## 2015-01-27 DIAGNOSIS — J45901 Unspecified asthma with (acute) exacerbation: Secondary | ICD-10-CM

## 2015-01-27 MED ORDER — BENZONATATE 100 MG PO CAPS: 100.0000 mg | ORAL_CAPSULE | Freq: Two times a day (BID) | ORAL | Status: DC | PRN

## 2015-01-27 MED ORDER — PREDNISONE 20 MG PO TABS: 40.0000 mg | ORAL_TABLET | Freq: Every day | ORAL | Status: DC

## 2015-01-27 NOTE — Progress Notes (Signed)
Pre visit review using our clinic review tool, if applicable. No additional management support is needed unless otherwise documented below in the visit note. 

## 2015-01-27 NOTE — Progress Notes (Addendum)
HPI:  URI: -started: 3 days ago -symptoms:nasal congestion, sore throat, cough, PND, mild SOB and needed to use her nebulizer last night and this morning with good response per her report, mild wheezing -denies:fever, SOB, NVD, tooth pain, thick or increased mucus production -has tried: her regular asthma medications  -sick contacts/travel/risks: denies flu exposure, tick exposure or or Ebola risks -Hx of: asthma and anxiety ROS: See pertinent positives and negatives per HPI.  Past Medical History  Diagnosis Date  . Asthma   . Anxiety     Past Surgical History  Procedure Laterality Date  . Tubal ligation    . Bladder surgery  2010    Family History  Problem Relation Age of Onset  . Diabetes Paternal Grandfather     History   Social History  . Marital Status: Married    Spouse Name: N/A    Number of Children: N/A  . Years of Education: N/A   Social History Main Topics  . Smoking status: Current Every Day Smoker -- 1.00 packs/day for 7 years    Types: Cigarettes  . Smokeless tobacco: None  . Alcohol Use: No  . Drug Use: None  . Sexual Activity: None   Other Topics Concern  . None   Social History Narrative     Current outpatient prescriptions:  .  ADVAIR DISKUS 250-50 MCG/DOSE AEPB, INHALE 1 PUFF INTO THE LUNGS AT BEDTIME, Disp: 60 each, Rfl: 3 .  albuterol (PROVENTIL HFA;VENTOLIN HFA) 108 (90 BASE) MCG/ACT inhaler, Inhale 2 puffs into the lungs every 4 (four) hours as needed for wheezing or shortness of breath., Disp: 1 Inhaler, Rfl: 0 .  ALPRAZolam (XANAX) 0.5 MG tablet, TAKE 1 TABLET BY MOUTH FOUR TIMES DAILY FOR ANXIETY, Disp: 20 tablet, Rfl: 0 .  citalopram (CELEXA) 20 MG tablet, Take 1 tablet (20 mg total) by mouth daily., Disp: 30 tablet, Rfl: 3 .  ibuprofen (ADVIL,MOTRIN) 200 MG tablet, Take 800 mg by mouth every 6 (six) hours as needed for moderate pain., Disp: , Rfl:  .  ipratropium-albuterol (DUONEB) 0.5-2.5 (3) MG/3ML SOLN, USE BY  NEBULIZATION EVERY 6 HOURS AS NEEDED, Disp: 90 mL, Rfl: 3 .  levalbuterol (XOPENEX HFA) 45 MCG/ACT inhaler, Inhale 1-2 puffs into the lungs every 4 (four) hours as needed for wheezing., Disp: 1 Inhaler, Rfl: 3 .  benzonatate (TESSALON) 100 MG capsule, Take 1 capsule (100 mg total) by mouth 2 (two) times daily as needed for cough., Disp: 20 capsule, Rfl: 0 .  predniSONE (DELTASONE) 20 MG tablet, Take 2 tablets (40 mg total) by mouth daily with breakfast., Disp: 10 tablet, Rfl: 0  EXAM:  Filed Vitals:   01/27/15 0922  BP: 102/70  Pulse: 112  Temp: 98.2 F (36.8 C)    Body mass index is 33.28 kg/(m^2).  GENERAL: vitals reviewed and listed above, alert, oriented, appears well hydrated and in no acute distress  HEENT: atraumatic, conjunttiva clear, no obvious abnormalities on inspection of external nose and ears, normal appearance of ear canals and TMs, clear nasal congestion, mild post oropharyngeal erythema with PND, no tonsillar edema or exudate, no sinus TTP  NECK: no obvious masses on inspection, few scattered exp wheezes, O2 96% on RA, no respiratory distress  LUNGS: clear to auscultation bilaterally, no wheezes, rales or rhonchi, good air movement  CV: HRRR, no peripheral edema  MS: moves all extremities without noticeable abnormality  PSYCH: pleasant and cooperative, no obvious depression or anxiety  ASSESSMENT AND PLAN:  Discussed the  following assessment and plan:  Viral upper respiratory infection  Asthma exacerbation  Tobacco abuse  -given HPI and exam findings today, a serious bacterial infection such as sinusitis or pneumonia is unlikely. We discussed potential etiologies, with VURI being most likely, with asthma exacerbation. We discussed treatment side effects, likely course, antibiotic misuse, transmission, and signs of developing a serious illness. -prednisone burst and close follow up and return and emergency precautions discussed -advised to quit  smoking -advised follow up with PCP in not improving or worsening and once better to discuss possible change in chronic asthma regimen given several asthma exacerbations per year requiring prednisone -of course, we advised to return or notify a doctor immediately if symptoms worsen or persist or new concerns arise.    Patient Instructions  -start the prednisone today  -use albuterol as needed  -if struggling to breath and not responding to inhalers seek emergency care immediatley  -plenty of rest and fluids  -nasal saline wash 2-3 times daily (use prepackaged nasal saline or bottled/distilled water if making your own)   -clean nose with nasal saline before using the nasal steroid or sinex  -can use AFRIN nasal spray for drainage and nasal congestion - but do NOT use longer then 3-4 days  -can use tylenol or ibuprofen as directed for aches and sorethroat  -in the winter time, using a humidifier at night is helpful (please follow cleaning instructions)  -if you are taking a cough medication - use only as directed, may also try a teaspoon of honey to coat the throat and throat lozenges  -for sore throat, salt water gargles can help  -follow up if you have fevers, facial pain, tooth pain, difficulty breathing or are worsening or not getting better in 5-7 days      Hermann Dottavio R.

## 2015-01-27 NOTE — Patient Instructions (Signed)
-  start the prednisone today  -use albuterol as needed  -if struggling to breath and not responding to inhalers seek emergency care immediatley  -plenty of rest and fluids  -nasal saline wash 2-3 times daily (use prepackaged nasal saline or bottled/distilled water if making your own)   -clean nose with nasal saline before using the nasal steroid or sinex  -can use AFRIN nasal spray for drainage and nasal congestion - but do NOT use longer then 3-4 days  -can use tylenol or ibuprofen as directed for aches and sorethroat  -in the winter time, using a humidifier at night is helpful (please follow cleaning instructions)  -if you are taking a cough medication - use only as directed, may also try a teaspoon of honey to coat the throat and throat lozenges  -for sore throat, salt water gargles can help  -follow up if you have fevers, facial pain, tooth pain, difficulty breathing or are worsening or not getting better in 5-7 days

## 2015-02-05 ENCOUNTER — Emergency Department (HOSPITAL_COMMUNITY)
Admission: EM | Admit: 2015-02-05 | Discharge: 2015-02-06 | Disposition: A | Discharge status: Home / Self Care | Payer: 59 | Attending: Emergency Medicine | Admitting: Emergency Medicine

## 2015-02-05 ENCOUNTER — Encounter (HOSPITAL_COMMUNITY): Payer: Self-pay | Admitting: Emergency Medicine

## 2015-02-05 ENCOUNTER — Emergency Department (HOSPITAL_COMMUNITY): Payer: 59

## 2015-02-05 DIAGNOSIS — Z720 Tobacco use: Secondary | ICD-10-CM | POA: Insufficient documentation

## 2015-02-05 DIAGNOSIS — W19XXXA Unspecified fall, initial encounter: Secondary | ICD-10-CM

## 2015-02-05 DIAGNOSIS — M25511 Pain in right shoulder: Secondary | ICD-10-CM

## 2015-02-05 DIAGNOSIS — Y9389 Activity, other specified: Secondary | ICD-10-CM | POA: Diagnosis not present

## 2015-02-05 DIAGNOSIS — Y998 Other external cause status: Secondary | ICD-10-CM | POA: Diagnosis not present

## 2015-02-05 DIAGNOSIS — Y9289 Other specified places as the place of occurrence of the external cause: Secondary | ICD-10-CM | POA: Insufficient documentation

## 2015-02-05 DIAGNOSIS — Z9889 Other specified postprocedural states: Secondary | ICD-10-CM | POA: Insufficient documentation

## 2015-02-05 DIAGNOSIS — F419 Anxiety disorder, unspecified: Secondary | ICD-10-CM | POA: Diagnosis not present

## 2015-02-05 DIAGNOSIS — W541XXA Struck by dog, initial encounter: Secondary | ICD-10-CM | POA: Insufficient documentation

## 2015-02-05 DIAGNOSIS — R52 Pain, unspecified: Secondary | ICD-10-CM

## 2015-02-05 DIAGNOSIS — S4991XA Unspecified injury of right shoulder and upper arm, initial encounter: Secondary | ICD-10-CM | POA: Diagnosis not present

## 2015-02-05 DIAGNOSIS — J45909 Unspecified asthma, uncomplicated: Secondary | ICD-10-CM | POA: Diagnosis not present

## 2015-02-05 DIAGNOSIS — Z7952 Long term (current) use of systemic steroids: Secondary | ICD-10-CM | POA: Diagnosis not present

## 2015-02-05 DIAGNOSIS — M25551 Pain in right hip: Secondary | ICD-10-CM

## 2015-02-05 DIAGNOSIS — Z79899 Other long term (current) drug therapy: Secondary | ICD-10-CM | POA: Diagnosis not present

## 2015-02-05 DIAGNOSIS — W108XXA Fall (on) (from) other stairs and steps, initial encounter: Secondary | ICD-10-CM | POA: Insufficient documentation

## 2015-02-05 DIAGNOSIS — S79911A Unspecified injury of right hip, initial encounter: Secondary | ICD-10-CM | POA: Diagnosis not present

## 2015-02-05 MED ORDER — KETOROLAC TROMETHAMINE 30 MG/ML IJ SOLN: 30.0000 mg | Freq: Once | INTRAMUSCULAR | Status: DC

## 2015-02-05 MED ORDER — KETOROLAC TROMETHAMINE 30 MG/ML IJ SOLN
60.0000 mg | Freq: Once | INTRAMUSCULAR | Status: AC
  Administered 2015-02-06: 60 mg via INTRAMUSCULAR
  Filled 2015-02-05: qty 2

## 2015-02-05 MED ORDER — ONDANSETRON 8 MG PO TBDP
8.0000 mg | ORAL_TABLET | Freq: Once | ORAL | Status: AC
  Administered 2015-02-06: 8 mg via ORAL
  Filled 2015-02-05: qty 1

## 2015-02-05 NOTE — ED Notes (Signed)
Pt states she was going from the upstairs to the downstairs and got tripped up by her dogs and fell down about 6 to 7 steps  Pt is c/o right hip pain, right shoulder, lower right leg, and facial pain  Pt states the pain in her hip is in the front  Pt has redness noted to her upper thigh and bruising noted to her lower leg   Pt has abrasions noted to her face

## 2015-02-06 MED ORDER — METHOCARBAMOL 500 MG PO TABS: 500.0000 mg | ORAL_TABLET | Freq: Four times a day (QID) | ORAL | Status: DC | PRN

## 2015-02-06 MED ORDER — IBUPROFEN 600 MG PO TABS: 600.0000 mg | ORAL_TABLET | Freq: Three times a day (TID) | ORAL | Status: DC | PRN

## 2015-02-06 NOTE — ED Provider Notes (Signed)
CSN: [id number]     Arrival date & time 02/05/15  2254 History   First MD Initiated Contact with Patient 02/05/15 2304     Chief Complaint  Patient presents with  . Fall  . Hip Pain  . Shoulder Pain     HPI Patient presents to the emergency department after falling down stairs this evening.  She fell down carpeted stairs.  She states that she injured her right shoulder as well as her right hip.  She had mild injury to her right lateral face.  She denies trismus or malocclusion.  No loss consciousness.  No use of anticoagulants.  Mild headache at this time.  She denies neck pain.  No weakness of her upper lower extremities.  She has a history of recurrent right shoulder dislocations reports that she just dislocated her right shoulder but was able to self reduce this at home.  She reports moderate pain in her right hip and pain with range of motion of her right hip.  She would not like any opioids for pain as she reports that it causes significant itching but she would be open to Toradol.  Family reports no altered mental status.  Past Medical History  Diagnosis Date  . Asthma   . Anxiety    Past Surgical History  Procedure Laterality Date  . Tubal ligation    . Bladder surgery  2010  . Shoulder surgery x 4     Family History  Problem Relation Age of Onset  . Diabetes Paternal Grandfather    History  Substance Use Topics  . Smoking status: Current Every Day Smoker -- 1.00 packs/day for 7 years    Types: Cigarettes  . Smokeless tobacco: Not on file  . Alcohol Use: No   OB History    No data available     Review of Systems  All other systems reviewed and are negative.     Allergies  Hydromorphone hcl  Home Medications   Prior to Admission medications   Medication Sig Start Date End Date Taking? Authorizing Provider  ADVAIR DISKUS 250-50 MCG/DOSE AEPB INHALE 1 PUFF INTO THE LUNGS AT BEDTIME 10/22/14  Yes Kristian Covey, MD  albuterol (PROVENTIL HFA;VENTOLIN HFA) 108  (90 BASE) MCG/ACT inhaler Inhale 2 puffs into the lungs every 4 (four) hours as needed for wheezing or shortness of breath. 10/06/14  Yes Lyanne Co, MD  ALPRAZolam Prudy Feeler) 0.5 MG tablet TAKE 1 TABLET BY MOUTH FOUR TIMES DAILY FOR ANXIETY 12/15/14  Yes Kristian Covey, MD  citalopram (CELEXA) 20 MG tablet Take 1 tablet (20 mg total) by mouth daily. 12/01/14  Yes Kristian Covey, MD  ipratropium-albuterol (DUONEB) 0.5-2.5 (3) MG/3ML SOLN USE BY NEBULIZATION EVERY 6 HOURS AS NEEDED 12/15/14  Yes Kristian Covey, MD  levalbuterol [facility] HFA) 45 MCG/ACT inhaler Inhale 1-2 puffs into the lungs every 4 (four) hours as needed for wheezing. 10/15/14  Yes Kristian Covey, MD  Multiple Vitamin (MULTIVITAMIN WITH MINERALS) TABS tablet Take 1 tablet by mouth daily.   Yes Historical Provider, MD  benzonatate (TESSALON) 100 MG capsule Take 1 capsule (100 mg total) by mouth 2 (two) times daily as needed for cough. 01/27/15   Terressa Koyanagi, DO  ibuprofen (ADVIL,MOTRIN) 600 MG tablet Take 1 tablet (600 mg total) by mouth every 8 (eight) hours as needed. 02/06/15   Lyanne Co, MD  methocarbamol (ROBAXIN) 500 MG tablet Take 1 tablet (500 mg total) by mouth every 6 (  six) hours as needed for muscle spasms. 02/06/15   Lyanne CoKevin M Kinan Safley, MD  predniSONE (DELTASONE) 20 MG tablet Take 2 tablets (40 mg total) by mouth daily with breakfast. Patient not taking: Reported on 02/06/2015 01/27/15   Terressa KoyanagiHannah R Kim, DO   BP 127/76 mmHg  Pulse 106  Temp(Src) 97.8 F (36.6 C) (Oral)  Resp 18  SpO2 96%  LMP 01/11/2015 (Approximate) Physical Exam  Constitutional: She is oriented to person, place, and time. She appears well-developed and well-nourished. No distress.  HENT:  Head: Normocephalic and atraumatic.  Eyes: EOM are normal.  Neck: Normal range of motion. Neck supple.  C-spine nontender.  Cardiovascular: Normal rate, regular rhythm and normal heart sounds.   Pulmonary/Chest: Effort normal and breath sounds  normal.  Abdominal: Soft. She exhibits no distension. There is no tenderness.  Musculoskeletal: Normal range of motion.  Mild pain with range of motion of the right shoulder.  Without obvious deformity.  Limited range of motion the right hip secondary to pain.  Mild bruising noted on the right lateral hip.  Full range of motion of the right knee.  Normal pulses in right foot.  No obvious shortening or rotation of the right lower extremity as compared to the left  Neurological: She is alert and oriented to person, place, and time.  Skin: Skin is warm and dry.  Psychiatric: She has a normal mood and affect. Judgment normal.  Nursing note and vitals reviewed.   ED Course  Procedures (including critical care time) Labs Review Labs Reviewed - No data to display  Imaging Review Dg Shoulder Right  02/05/2015   CLINICAL DATA:  Patient tripped and fell down stairs this evening. Right shoulder pain. Right lateral and anterior hip pain.  EXAM: RIGHT SHOULDER - 2+ VIEW  COMPARISON:  None.  FINDINGS: Postoperative changes with surgical anchors in the humeral head. No evidence of acute fracture or dislocation. Acromioclavicular and coracoclavicular spaces are maintained. Visualized ribs are unremarkable. The  IMPRESSION: Postoperative changes in the right shoulder. No acute bony abnormalities.   Electronically Signed   By: Burman NievesWilliam  Stevens M.D.   On: 02/05/2015 23:59   Dg Hip Unilat With Pelvis 2-3 Views Right  02/06/2015   CLINICAL DATA:  Tripped over dog and fell downstairs this evening. Hip pain.  EXAM: RIGHT HIP (WITH PELVIS) 2-3 VIEWS  COMPARISON:  None.  FINDINGS: Femoral heads are well formed and located. Hip joint spaces are intact. Sacroiliac joints are symmetric.  No destructive bony lesions. Included soft tissue planes are non-suspicious.  IMPRESSION: Negative.   Electronically Signed   By: Awilda Metroourtnay  Bloomer   On: 02/06/2015 00:02     EKG Interpretation None      MDM   Final diagnoses:   Fall  Pain  Right shoulder pain  Right hip pain    Patient is feeling much better this time.  X-rays demonstrated no evidence of fracture.  She is ambulatory in the emergency department.  Home with anti-inflammatories and muscle relaxants.  She understands to return to the ER for new or worsening symptoms.  C-spine cleared by Nexus criteria.  No indication for head CT.  No loss consciousness and no use of anticoagulants.  No neurologic deficit.    Lyanne CoKevin M Dameion Briles, MD 02/06/15 [phone]

## 2015-02-13 ENCOUNTER — Other Ambulatory Visit: Payer: Self-pay | Admitting: Family Medicine

## 2015-03-07 ENCOUNTER — Other Ambulatory Visit: Payer: Self-pay | Admitting: Family Medicine

## 2015-03-12 ENCOUNTER — Emergency Department (HOSPITAL_COMMUNITY): Payer: 59

## 2015-03-12 ENCOUNTER — Encounter (HOSPITAL_COMMUNITY): Payer: Self-pay

## 2015-03-12 ENCOUNTER — Inpatient Hospital Stay (HOSPITAL_COMMUNITY)
Admission: EM | Admit: 2015-03-12 | Discharge: 2015-03-13 | DRG: 203 | Disposition: A | Discharge status: Home / Self Care | Payer: 59 | Attending: Family Medicine | Admitting: Family Medicine

## 2015-03-12 DIAGNOSIS — R0602 Shortness of breath: Secondary | ICD-10-CM

## 2015-03-12 DIAGNOSIS — J45901 Unspecified asthma with (acute) exacerbation: Secondary | ICD-10-CM | POA: Diagnosis not present

## 2015-03-12 DIAGNOSIS — F17210 Nicotine dependence, cigarettes, uncomplicated: Secondary | ICD-10-CM | POA: Diagnosis present

## 2015-03-12 DIAGNOSIS — J189 Pneumonia, unspecified organism: Secondary | ICD-10-CM | POA: Diagnosis present

## 2015-03-12 LAB — URINALYSIS, ROUTINE W REFLEX MICROSCOPIC
BILIRUBIN URINE: NEGATIVE
Glucose, UA: NEGATIVE mg/dL
Ketones, ur: NEGATIVE mg/dL
Leukocytes, UA: NEGATIVE
Nitrite: NEGATIVE
PROTEIN: NEGATIVE mg/dL
Specific Gravity, Urine: 1.012 (ref 1.005–1.030)
UROBILINOGEN UA: 0.2 mg/dL (ref 0.0–1.0)
pH: 7 (ref 5.0–8.0)

## 2015-03-12 LAB — CBC
HCT: 41.5 % (ref 36.0–46.0)
HEMOGLOBIN: 13.7 g/dL (ref 12.0–15.0)
MCH: 30 pg (ref 26.0–34.0)
MCHC: 33 g/dL (ref 30.0–36.0)
MCV: 90.8 fL (ref 78.0–100.0)
Platelets: 374 10*3/uL (ref 150–400)
RBC: 4.57 MIL/uL (ref 3.87–5.11)
RDW: 13.7 % (ref 11.5–15.5)
WBC: 18.6 10*3/uL — AB (ref 4.0–10.5)

## 2015-03-12 LAB — CBC WITH DIFFERENTIAL/PLATELET
BASOS PCT: 0 % (ref 0–1)
Basophils Absolute: 0 10*3/uL (ref 0.0–0.1)
EOS ABS: 0.3 10*3/uL (ref 0.0–0.7)
EOS PCT: 1 % (ref 0–5)
HCT: 41 % (ref 36.0–46.0)
Hemoglobin: 14 g/dL (ref 12.0–15.0)
Lymphocytes Relative: 5 % — ABNORMAL LOW (ref 12–46)
Lymphs Abs: 1.3 10*3/uL (ref 0.7–4.0)
MCH: 30.6 pg (ref 26.0–34.0)
MCHC: 34.1 g/dL (ref 30.0–36.0)
MCV: 89.5 fL (ref 78.0–100.0)
Monocytes Absolute: 0.8 10*3/uL (ref 0.1–1.0)
Monocytes Relative: 3 % (ref 3–12)
Neutro Abs: 22.7 10*3/uL — ABNORMAL HIGH (ref 1.7–7.7)
Neutrophils Relative %: 91 % — ABNORMAL HIGH (ref 43–77)
Platelets: 355 10*3/uL (ref 150–400)
RBC: 4.58 MIL/uL (ref 3.87–5.11)
RDW: 13.5 % (ref 11.5–15.5)
WBC: 25.1 10*3/uL — ABNORMAL HIGH (ref 4.0–10.5)

## 2015-03-12 LAB — URINE MICROSCOPIC-ADD ON

## 2015-03-12 LAB — POC URINE PREG, ED: Preg Test, Ur: NEGATIVE

## 2015-03-12 LAB — COMPREHENSIVE METABOLIC PANEL
ALBUMIN: 4.1 g/dL (ref 3.5–5.2)
ALT: 14 U/L (ref 0–35)
AST: 26 U/L (ref 0–37)
Alkaline Phosphatase: 65 U/L (ref 39–117)
Anion gap: 11 (ref 5–15)
BUN: 10 mg/dL (ref 6–23)
CO2: 23 mmol/L (ref 19–32)
CREATININE: 0.77 mg/dL (ref 0.50–1.10)
Calcium: 9 mg/dL (ref 8.4–10.5)
Chloride: 106 mmol/L (ref 96–112)
Glucose, Bld: 121 mg/dL — ABNORMAL HIGH (ref 70–99)
POTASSIUM: 3.9 mmol/L (ref 3.5–5.1)
SODIUM: 140 mmol/L (ref 135–145)
Total Bilirubin: 0.8 mg/dL (ref 0.3–1.2)
Total Protein: 7.1 g/dL (ref 6.0–8.3)

## 2015-03-12 LAB — CREATININE, SERUM
CREATININE: 0.72 mg/dL (ref 0.50–1.10)
GFR calc Af Amer: >90 mL/min (ref >90)

## 2015-03-12 MED ORDER — LEVALBUTEROL HCL 0.63 MG/3ML IN NEBU
0.6300 mg | INHALATION_SOLUTION | Freq: Four times a day (QID) | RESPIRATORY_TRACT | Status: DC
  Administered 2015-03-12 – 2015-03-13 (×4): 0.63 mg via RESPIRATORY_TRACT
  Filled 2015-03-12 (×4): qty 3

## 2015-03-12 MED ORDER — AZITHROMYCIN 500 MG PO TABS
500.0000 mg | ORAL_TABLET | ORAL | Status: DC
  Administered 2015-03-13: 500 mg via ORAL
  Filled 2015-03-12: qty 1

## 2015-03-12 MED ORDER — TIOTROPIUM BROMIDE MONOHYDRATE 18 MCG IN CAPS
18.0000 ug | ORAL_CAPSULE | Freq: Every day | RESPIRATORY_TRACT | Status: DC
  Administered 2015-03-12 – 2015-03-13 (×2): 18 ug via RESPIRATORY_TRACT
  Filled 2015-03-12: qty 5

## 2015-03-12 MED ORDER — SODIUM CHLORIDE 0.9 % IJ SOLN: 3.0000 mL | INTRAMUSCULAR | Status: DC | PRN

## 2015-03-12 MED ORDER — HEPARIN SODIUM (PORCINE) 5000 UNIT/ML IJ SOLN
5000.0000 [IU] | Freq: Three times a day (TID) | INTRAMUSCULAR | Status: DC
  Administered 2015-03-12 – 2015-03-13 (×2): 5000 [IU] via SUBCUTANEOUS
  Filled 2015-03-12 (×5): qty 1

## 2015-03-12 MED ORDER — PREDNISONE 50 MG PO TABS
50.0000 mg | ORAL_TABLET | Freq: Every day | ORAL | Status: DC
  Administered 2015-03-13: 50 mg via ORAL
  Filled 2015-03-12 (×2): qty 1

## 2015-03-12 MED ORDER — ALBUTEROL SULFATE HFA 108 (90 BASE) MCG/ACT IN AERS
4.0000 | INHALATION_SPRAY | Freq: Once | RESPIRATORY_TRACT | Status: AC
  Administered 2015-03-12: 4 via RESPIRATORY_TRACT

## 2015-03-12 MED ORDER — PREDNISONE 20 MG PO TABS
60.0000 mg | ORAL_TABLET | Freq: Once | ORAL | Status: AC
  Administered 2015-03-12: 60 mg via ORAL
  Filled 2015-03-12: qty 3

## 2015-03-12 MED ORDER — SODIUM CHLORIDE 0.9 % IJ SOLN
3.0000 mL | Freq: Two times a day (BID) | INTRAMUSCULAR | Status: DC
  Administered 2015-03-12 – 2015-03-13 (×2): 3 mL via INTRAVENOUS

## 2015-03-12 MED ORDER — ALBUTEROL SULFATE HFA 108 (90 BASE) MCG/ACT IN AERS
4.0000 | INHALATION_SPRAY | Freq: Once | RESPIRATORY_TRACT | Status: AC
  Administered 2015-03-12: 4 via RESPIRATORY_TRACT
  Filled 2015-03-12: qty 6.7

## 2015-03-12 MED ORDER — AZITHROMYCIN 500 MG PO TABS: 500.0000 mg | ORAL_TABLET | ORAL | Status: DC

## 2015-03-12 MED ORDER — SODIUM CHLORIDE 0.9 % IV BOLUS (SEPSIS)
1000.0000 mL | Freq: Once | INTRAVENOUS | Status: AC
  Administered 2015-03-12: 1000 mL via INTRAVENOUS

## 2015-03-12 MED ORDER — CITALOPRAM HYDROBROMIDE 20 MG PO TABS
20.0000 mg | ORAL_TABLET | Freq: Every day | ORAL | Status: DC
  Administered 2015-03-12 – 2015-03-13 (×2): 20 mg via ORAL
  Filled 2015-03-12 (×2): qty 1

## 2015-03-12 MED ORDER — AZITHROMYCIN 250 MG PO TABS
500.0000 mg | ORAL_TABLET | Freq: Once | ORAL | Status: AC
  Administered 2015-03-12: 500 mg via ORAL
  Filled 2015-03-12: qty 2

## 2015-03-12 MED ORDER — LORATADINE 10 MG PO TABS
10.0000 mg | ORAL_TABLET | Freq: Every day | ORAL | Status: DC
  Administered 2015-03-12 – 2015-03-13 (×2): 10 mg via ORAL
  Filled 2015-03-12 (×2): qty 1

## 2015-03-12 MED ORDER — ONDANSETRON HCL 4 MG/2ML IJ SOLN
4.0000 mg | Freq: Once | INTRAMUSCULAR | Status: AC
  Administered 2015-03-12: 4 mg via INTRAVENOUS
  Filled 2015-03-12: qty 2

## 2015-03-12 MED ORDER — DEXTROSE 5 % IV SOLN: 1.0000 g | INTRAVENOUS | Status: DC

## 2015-03-12 MED ORDER — CEFTRIAXONE SODIUM IN DEXTROSE 20 MG/ML IV SOLN
1.0000 g | INTRAVENOUS | Status: DC
  Filled 2015-03-12: qty 50

## 2015-03-12 MED ORDER — ADULT MULTIVITAMIN W/MINERALS CH
1.0000 | ORAL_TABLET | Freq: Every day | ORAL | Status: DC
  Administered 2015-03-13: 1 via ORAL
  Filled 2015-03-12: qty 1

## 2015-03-12 MED ORDER — MOMETASONE FURO-FORMOTEROL FUM 100-5 MCG/ACT IN AERO
2.0000 | INHALATION_SPRAY | Freq: Two times a day (BID) | RESPIRATORY_TRACT | Status: DC
  Administered 2015-03-12 – 2015-03-13 (×2): 2 via RESPIRATORY_TRACT
  Filled 2015-03-12: qty 8.8

## 2015-03-12 MED ORDER — SODIUM CHLORIDE 0.9 % IV SOLN: 250.0000 mL | INTRAVENOUS | Status: DC | PRN

## 2015-03-12 MED ORDER — DEXTROSE 5 % IV SOLN
1.0000 g | Freq: Once | INTRAVENOUS | Status: AC
  Administered 2015-03-12: 16:00:00 via INTRAVENOUS
  Filled 2015-03-12: qty 10

## 2015-03-12 MED ORDER — ALPRAZOLAM 0.5 MG PO TABS: 0.5000 mg | ORAL_TABLET | Freq: Every day | ORAL | Status: DC | PRN

## 2015-03-12 NOTE — ED Provider Notes (Signed)
CSN: [id number]     Arrival date & time 03/12/15  0754 History   First MD Initiated Contact with Patient 03/12/15 0802     No chief complaint on file.    (Consider location/radiation/quality/duration/timing/severity/associated sxs/prior Treatment) Patient is a 39 y.o. female presenting with vomiting and shortness of breath.  Emesis Severity:  Moderate Duration:  2 days Timing:  Intermittent Quality:  Stomach contents ("dry heaving") Progression since onset: severe two daysa go, nearly resolved yesterday, severe again over past 8 hours.  Relieved by:  Nothing Worsened by:  Nothing tried Ineffective treatments: PO zofran "can't keep it down" Associated symptoms: diarrhea (x 1)   Associated symptoms: no abdominal pain and no fever   Associated symptoms comment:  Low back pain.  Shortness of Breath Severity:  Severe Onset quality:  Gradual Timing:  Constant Progression:  Worsening Chronicity:  Recurrent Relieved by:  Inhaler Ineffective treatments:  None tried Associated symptoms: cough and vomiting   Associated symptoms: no abdominal pain and no fever     Past Medical History  Diagnosis Date  . Asthma   . Anxiety    Past Surgical History  Procedure Laterality Date  . Tubal ligation    . Bladder surgery  2010  . Shoulder surgery x 4     Family History  Problem Relation Age of Onset  . Diabetes Paternal Grandfather    History  Substance Use Topics  . Smoking status: Current Every Day Smoker -- 0.50 packs/day for 7 years    Types: Cigarettes  . Smokeless tobacco: Not on file  . Alcohol Use: No   OB History    No data available     Review of Systems  Constitutional: Negative for fever.  Respiratory: Positive for cough and shortness of breath.   Gastrointestinal: Positive for vomiting and diarrhea (x 1). Negative for abdominal pain.  All other systems reviewed and are negative.     Allergies  Hydromorphone hcl  Home Medications   Prior to Admission  medications   Medication Sig Start Date End Date Taking? Authorizing Provider  ADVAIR DISKUS 250-50 MCG/DOSE AEPB INHALE 1 PUFF INTO THE LUNGS AT BEDTIME 02/16/15   Kristian Covey, MD  albuterol (PROVENTIL HFA;VENTOLIN HFA) 108 (90 BASE) MCG/ACT inhaler Inhale 2 puffs into the lungs every 4 (four) hours as needed for wheezing or shortness of breath. 10/06/14   Azalia Bilis, MD  ALPRAZolam Prudy Feeler) 0.5 MG tablet TAKE 1 TABLET BY MOUTH FOUR TIMES DAILY FOR ANXIETY 12/15/14   Kristian Covey, MD  benzonatate (TESSALON) 100 MG capsule Take 1 capsule (100 mg total) by mouth 2 (two) times daily as needed for cough. 01/27/15   Terressa Koyanagi, DO  citalopram (CELEXA) 20 MG tablet Take 1 tablet (20 mg total) by mouth daily. 12/01/14   Kristian Covey, MD  ibuprofen (ADVIL,MOTRIN) 600 MG tablet Take 1 tablet (600 mg total) by mouth every 8 (eight) hours as needed. 02/06/15   Azalia Bilis, MD  ipratropium-albuterol (DUONEB) 0.5-2.5 (3) MG/3ML SOLN USE BY NEBULIZATION EVERY 6 HOURS AS NEEDED 12/15/14   Kristian Covey, MD  levalbuterol [facility] HFA) 45 MCG/ACT inhaler Inhale 1-2 puffs into the lungs every 4 (four) hours as needed for wheezing. 10/15/14   Kristian Covey, MD  methocarbamol (ROBAXIN) 500 MG tablet Take 1 tablet (500 mg total) by mouth every 6 (six) hours as needed for muscle spasms. 02/06/15   Azalia Bilis, MD  Multiple Vitamin (MULTIVITAMIN WITH MINERALS) TABS tablet Take  1 tablet by mouth daily.    Historical Provider, MD  predniSONE (DELTASONE) 20 MG tablet Take 2 tablets (40 mg total) by mouth daily with breakfast. Patient not taking: Reported on 02/06/2015 01/27/15   Terressa Koyanagi, DO  VENTOLIN HFA 108 (90 BASE) MCG/ACT inhaler INHALE 2 PUFFS BY MOUTH INTO THE LUNGS EVERY 4 HOURS AS NEEDED FOR WHEEZING OR SHORTNESS OF BREATH 03/09/15   Kristian Covey, MD   BP 118/84 mmHg  Pulse 104  Temp(Src) 97.9 F (36.6 C)  Resp 24  Ht  (1.575 m)  Wt 191 lb (86.637 kg)  BMI 34.93 kg/m2   SpO2 98%  LMP 03/06/2015 Physical Exam  Constitutional: She is oriented to person, place, and time. She appears well-developed and well-nourished. No distress.  HENT:  Head: Normocephalic and atraumatic.  Mouth/Throat: Oropharynx is clear and moist.  Eyes: Conjunctivae are normal. Pupils are equal, round, and reactive to light. No scleral icterus.  Neck: Neck supple.  Cardiovascular: Regular rhythm, normal heart sounds and intact distal pulses.  Tachycardia present.   No murmur heard. Pulmonary/Chest: Effort normal. No stridor. No respiratory distress. She has wheezes (mild). She has no rales.  Abdominal: Soft. Bowel sounds are normal. She exhibits no distension. There is no tenderness.  Musculoskeletal: Normal range of motion. She exhibits no edema.  Neurological: She is alert and oriented to person, place, and time.  Skin: Skin is warm and dry. No rash noted.  Psychiatric: She has a normal mood and affect. Her behavior is normal.  Nursing note and vitals reviewed.   ED Course  Procedures (including critical care time) Labs Review Labs Reviewed  CBC WITH DIFFERENTIAL/PLATELET - Abnormal; Notable for the following:    WBC 25.1 (*)    Neutrophils Relative % 91 (*)    Lymphocytes Relative 5 (*)    Neutro Abs 22.7 (*)    All other components within normal limits  COMPREHENSIVE METABOLIC PANEL - Abnormal; Notable for the following:    Glucose, Bld 121 (*)    All other components within normal limits  URINALYSIS, ROUTINE W REFLEX MICROSCOPIC  POC URINE PREG, ED    Imaging Review Dg Chest 2 View  03/12/2015   CLINICAL DATA:  Initial evaluation for flu like symptoms for 2 days with shortness of breath weakness and increased white blood count, patient smokes  EXAM: CHEST  2 VIEW  COMPARISON:  10/06/2014  FINDINGS: Heart size and vascular pattern are normal. Moderate prominence of the interstitium in the bilateral central lungs. Mild bronchitic change. No consolidation or effusion. No  significant change from prior study.  IMPRESSION: Moderate prominence of the interstitium. While this could be related to atypical pneumonia, it is noted that it is unchanged from 10/06/2014.   Electronically Signed   By: Esperanza Heir M.D.   On: 03/12/2015 12:06  All radiology studies independently viewed by me.      EKG Interpretation None      MDM   Final diagnoses:  Shortness of breath  Asthma exacerbation  CAP (community acquired pneumonia)    39 yo female with hx of asthma who presents with shortness of breath.  Initially, appeared to be breathing relatively comfortably with minimal wheezing (had recently finished home albuterol treatments).  She subsequently developed significant wheezing which resolved with a single breathing treatment.  However, the albuterol did not resolve her tachypnea and hypoxia.  Treated for CAP due to leukocytosis and CXR results suggesting possible pneumonia.  Plan admission to hospital  medicine.      Blake DivineJohn Yvaine Jankowiak, MD 03/12/15 [phone]

## 2015-03-12 NOTE — ED Notes (Signed)
Ambulated with pulse ox stated 90-91%.

## 2015-03-12 NOTE — ED Notes (Signed)
Patient asked to provide urine sample, pt states she cannot void at this time.

## 2015-03-12 NOTE — Progress Notes (Signed)
Pt arrived on unit. Alert and oriented. O2 at 2l [place] applied. Tele box number 64 applied. ccmd made aware. In no distress at this time. Placed on droplet isolation for pending resp viral panel. Education provided. Vwilliams,rn.

## 2015-03-12 NOTE — H&P (Signed)
[facility] PROGRESS NOTE  Amy [name] ZOX:[id number] DOB: 09/27/1976 DOA: 03/12/2015 PCP: Amy CoveyBURCHETTE,BRUCE W, MD  Assessment/Plan: Active Problems:   Asthma exacerbation - At this point will continue steroids - bronchodilators - dulera and spiriva - supplemental oxygen - given improvement will avoid magnesium. Will obtain magnesium level in case we need to administer magnesium. - xopenex given tachycardia    CAP (community acquired pneumonia) - placed routine pna order set - continue IV rocephin and azithromycin  Code Status: full Family Communication: no family at bedside.  Disposition Plan: telemetry monitoring   Consultants:  none  Procedures:  none  Antibiotics:  Azithromycin   Rocephin   HPI/Subjective: Pt has no new complaints. No acute issues reported overnight.  Objective: Filed Vitals:   03/12/15 1603  BP: 92/50  Pulse: 107  Temp: 98.4 F (36.9 C)  Resp: 21   No intake or output data in the 24 hours ending 03/12/15 1605 Filed Weights   03/12/15 0804  Weight: 86.637 kg (191 lb)    Exam:   General:  Pt in nad, alert and awake  Cardiovascular: rrr, no mrg  Respiratory: increased wob, wheezes expiratory, equal chest rise  Abdomen: soft, Nt, nd  Musculoskeletal: no cyanosis or clubbing   Data Reviewed: Basic Metabolic Panel:  Recent Labs Lab 03/12/15 0940  NA 140  K 3.9  CL 106  CO2 23  GLUCOSE 121*  BUN 10  CREATININE 0.77  CALCIUM 9.0   Liver Function Tests:  Recent Labs Lab 03/12/15 0940  AST 26  ALT 14  ALKPHOS 65  BILITOT 0.8  PROT 7.1  ALBUMIN 4.1   No results for input(s): LIPASE, AMYLASE in the last 168 hours. No results for input(s): AMMONIA in the last 168 hours. CBC:  Recent Labs Lab 03/12/15 0940  WBC 25.1*  NEUTROABS 22.7*  HGB 14.0  HCT 41.0  MCV 89.5  PLT 355   Cardiac Enzymes: No results for input(s): CKTOTAL, CKMB, CKMBINDEX, TROPONINI in the last 168 hours. BNP (last 3 results) No  results for input(s): BNP in the last 8760 hours.  ProBNP (last 3 results)  Recent Labs  03/18/14 1535  PROBNP 7.2    CBG: No results for input(s): GLUCAP in the last 168 hours.  No results found for this or any previous visit (from the past 240 hour(s)).   Studies: Dg Chest 2 View  03/12/2015   CLINICAL DATA:  Initial evaluation for flu like symptoms for 2 days with shortness of breath weakness and increased white blood count, patient smokes  EXAM: CHEST  2 VIEW  COMPARISON:  10/06/2014  FINDINGS: Heart size and vascular pattern are normal. Moderate prominence of the interstitium in the bilateral central lungs. Mild bronchitic change. No consolidation or effusion. No significant change from prior study.  IMPRESSION: Moderate prominence of the interstitium. While this could be related to atypical pneumonia, it is noted that it is unchanged from 10/06/2014.   Electronically Signed   By: Esperanza Heiraymond  Rubner M.D.   On: 03/12/2015 12:06    Scheduled Meds:  Continuous Infusions: . cefTRIAXone (ROCEPHIN)  IV 100 mL/hr at 03/12/15 1542     Time spent: > 35 miutes    [name], [name]  [facility] Pager [phone] If 7PM-7AM, please contact night-coverage at [email or web address], password [email or web address] 03/12/2015, 4:05 PM  LOS: 0 days

## 2015-03-12 NOTE — ED Notes (Signed)
Pt had flu like symptoms for 2 days per husband.  Hx of asthma.  Husband reports pt not being able to keep food/fluids down, SOB and dehydration.

## 2015-03-12 NOTE — ED Notes (Signed)
Family at bedside. 

## 2015-03-12 NOTE — ED Notes (Signed)
Pt is anxious. Not able to sit/lie on bed

## 2015-03-12 NOTE — ED Notes (Signed)
Patient and family member refused for me to get blood, they wanted to wait until she got an IV

## 2015-03-12 NOTE — ED Notes (Signed)
Report given Revonda StandardVera, Rn

## 2015-03-13 DIAGNOSIS — R0602 Shortness of breath: Secondary | ICD-10-CM | POA: Diagnosis present

## 2015-03-13 DIAGNOSIS — F17210 Nicotine dependence, cigarettes, uncomplicated: Secondary | ICD-10-CM | POA: Diagnosis present

## 2015-03-13 DIAGNOSIS — J45901 Unspecified asthma with (acute) exacerbation: Secondary | ICD-10-CM | POA: Diagnosis present

## 2015-03-13 LAB — HIV ANTIBODY (ROUTINE TESTING W REFLEX): HIV Screen 4th Generation wRfx: NONREACTIVE

## 2015-03-13 LAB — STREP PNEUMONIAE URINARY ANTIGEN: Strep Pneumo Urinary Antigen: NEGATIVE

## 2015-03-13 MED ORDER — PREDNISONE 50 MG PO TABS: ORAL_TABLET | ORAL | Status: DC

## 2015-03-13 NOTE — Progress Notes (Signed)
Discharge instructions given on medications,and follow up visits,patient verbalized understanding. Prescription sent with patient. No c/o pain or discomfort noted. Accompanied by staff to an awaiting vehicle.

## 2015-03-13 NOTE — Discharge Summary (Signed)
Physician Discharge Summary  Amy [name] ZOX:[id number] DOB: 04/30/1976 DOA: 03/12/2015  PCP: Kristian CoveyBURCHETTE,BRUCE W, MD  Admit date: 03/12/2015 Discharge date: 03/13/2015  Time spent: > 35 minutes  Recommendations for Outpatient Follow-up:  1. I do not suspect patient has pneumonia. Her clinical condition I suspect it would have been worse. Should she develop persistent fevers please consider continuing antibiotics.  Discharge Diagnoses:  Active Problems:   Asthma exacerbation   CAP (community acquired pneumonia)   Discharge Condition: Stable  Diet recommendation: Regular diet  Filed Weights   03/12/15 0804  Weight: 86.637 kg (191 lb)    History of present illness:  Patient is a 39 year old with history of asthma who presented with asthma exacerbation.  Hospital Course:  Active Problems:  Asthma exacerbation - Patient will continue short steroid course for 5 days total. - As mentioned above I do not suspect patient has bacterial pneumonia. As such will not continue antibiotics on discharge. Discussed with patient who is agreeable   Procedures:  None  Consultations:  None  Discharge Exam: Filed Vitals:   03/13/15 0510  BP: 114/67  Pulse: 88  Temp: 97.8 F (36.6 C)  Resp: 20    General: Patient in no acute distress, alert and awake Cardiovascular: Regular rate and rhythm, no murmurs or rubs Respiratory: Clear to auscultation bilaterally, no wheezes, speaking in full sentences  Discharge Instructions   Discharge Instructions    Call MD for:  difficulty breathing, headache or visual disturbances    Complete by:  As directed      Call MD for:  temperature >100.4    Complete by:  As directed      Diet - low sodium heart healthy    Complete by:  As directed      Discharge instructions    Complete by:  As directed   Please be sure to follow up with your primary care physician in 1-2 weeks or sooner should any new concerns arise.     Increase activity slowly     Complete by:  As directed           Current Discharge Medication List    CONTINUE these medications which have CHANGED   Details  predniSONE (DELTASONE) 50 MG tablet Take one table by mouth daily Qty: 3 tablet, Refills: 0      CONTINUE these medications which have NOT CHANGED   Details  ADVAIR DISKUS 250-50 MCG/DOSE AEPB INHALE 1 PUFF INTO THE LUNGS AT BEDTIME Qty: 60 each, Refills: 3    ALPRAZolam (XANAX) 0.5 MG tablet TAKE 1 TABLET BY MOUTH FOUR TIMES DAILY FOR ANXIETY Qty: 20 tablet, Refills: 0    cetirizine (ZYRTEC) 10 MG tablet Take 10 mg by mouth daily.    citalopram (CELEXA) 20 MG tablet Take 1 tablet (20 mg total) by mouth daily. Qty: 30 tablet, Refills: 3    ibuprofen (ADVIL,MOTRIN) 600 MG tablet Take 1 tablet (600 mg total) by mouth every 8 (eight) hours as needed. Qty: 15 tablet, Refills: 0    ipratropium-albuterol (DUONEB) 0.5-2.5 (3) MG/3ML SOLN USE 3MLS BY NEBULIZATION EVERY 6 HOURS AS NEEDED Qty: 90 mL, Refills: 3    levalbuterol (XOPENEX HFA) 45 MCG/ACT inhaler Inhale 1-2 puffs into the lungs every 4 (four) hours as needed for wheezing. Qty: 1 Inhaler, Refills: 3    Multiple Vitamin (MULTIVITAMIN WITH MINERALS) TABS tablet Take 1 tablet by mouth daily.      STOP taking these medications     albuterol (PROVENTIL HFA;VENTOLIN HFA)  108 (90 BASE) MCG/ACT inhaler      benzonatate (TESSALON) 100 MG capsule      methocarbamol (ROBAXIN) 500 MG tablet      VENTOLIN HFA 108 (90 BASE) MCG/ACT inhaler        Allergies  Allergen Reactions  . Hydromorphone Hcl Itching and Anxiety    No opioids.      The results of significant diagnostics from this hospitalization (including imaging, microbiology, ancillary and laboratory) are listed below for reference.    Significant Diagnostic Studies: Dg Chest 2 View  03/12/2015   CLINICAL DATA:  Initial evaluation for flu like symptoms for 2 days with shortness of breath weakness and increased white blood count,  patient smokes  EXAM: CHEST  2 VIEW  COMPARISON:  10/06/2014  FINDINGS: Heart size and vascular pattern are normal. Moderate prominence of the interstitium in the bilateral central lungs. Mild bronchitic change. No consolidation or effusion. No significant change from prior study.  IMPRESSION: Moderate prominence of the interstitium. While this could be related to atypical pneumonia, it is noted that it is unchanged from 10/06/2014.   Electronically Signed   By: Esperanza Heir M.D.   On: 03/12/2015 12:06    Microbiology: No results found for this or any previous visit (from the past 240 hour(s)).   Labs: Basic Metabolic Panel:  Recent Labs Lab 03/12/15 0940 03/12/15 1809  NA 140  --   K 3.9  --   CL 106  --   CO2 23  --   GLUCOSE 121*  --   BUN 10  --   CREATININE 0.77 0.72  CALCIUM 9.0  --    Liver Function Tests:  Recent Labs Lab 03/12/15 0940  AST 26  ALT 14  ALKPHOS 65  BILITOT 0.8  PROT 7.1  ALBUMIN 4.1   No results for input(s): LIPASE, AMYLASE in the last 168 hours. No results for input(s): AMMONIA in the last 168 hours. CBC:  Recent Labs Lab 03/12/15 0940 03/12/15 1809  WBC 25.1* 18.6*  NEUTROABS 22.7*  --   HGB 14.0 13.7  HCT 41.0 41.5  MCV 89.5 90.8  PLT 355 374   Cardiac Enzymes: No results for input(s): CKTOTAL, CKMB, CKMBINDEX, TROPONINI in the last 168 hours. BNP: BNP (last 3 results) No results for input(s): BNP in the last 8760 hours.  ProBNP (last 3 results)  Recent Labs  03/18/14 1535  PROBNP 7.2    CBG: No results for input(s): GLUCAP in the last 168 hours.     Signed:  Penny Pia  [facility] 03/13/2015, 2:06 PM

## 2015-03-13 NOTE — Progress Notes (Signed)
UR completed 

## 2015-03-15 LAB — RESPIRATORY VIRUS PANEL
Adenovirus: NEGATIVE
INFLUENZA B 1: NEGATIVE
Influenza A: NEGATIVE
Metapneumovirus: NEGATIVE
Parainfluenza 1: NEGATIVE
Parainfluenza 2: NEGATIVE
Parainfluenza 3: NEGATIVE
Respiratory Syncytial Virus A: NEGATIVE
Respiratory Syncytial Virus B: NEGATIVE
Rhinovirus: NEGATIVE

## 2015-03-16 LAB — LEGIONELLA ANTIGEN, URINE

## 2015-03-19 LAB — CULTURE, BLOOD (ROUTINE X 2)
Culture: NO GROWTH
Culture: NO GROWTH

## 2015-03-31 ENCOUNTER — Ambulatory Visit (INDEPENDENT_AMBULATORY_CARE_PROVIDER_SITE_OTHER): Payer: 59 | Admitting: Family Medicine

## 2015-03-31 ENCOUNTER — Encounter: Payer: Self-pay | Admitting: Family Medicine

## 2015-03-31 VITALS — BP 110/80 | HR 102 | Temp 98.2°F | Wt 196.0 lb

## 2015-03-31 DIAGNOSIS — F4311 Post-traumatic stress disorder, acute: Secondary | ICD-10-CM

## 2015-03-31 NOTE — Patient Instructions (Signed)
Go ahead and increase the Celexa to 30 mg (one and one half tablets) once daily Call to set up counseling as discussed.
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
## 2015-03-31 NOTE — Progress Notes (Signed)
Pre visit review using our clinic review tool, if applicable. No additional management support is needed unless otherwise documented below in the visit note. 

## 2015-04-03 ENCOUNTER — Ambulatory Visit (INDEPENDENT_AMBULATORY_CARE_PROVIDER_SITE_OTHER): Payer: 59 | Admitting: Psychology

## 2015-04-03 DIAGNOSIS — F430 Acute stress reaction: Secondary | ICD-10-CM | POA: Diagnosis not present

## 2015-04-06 ENCOUNTER — Ambulatory Visit (INDEPENDENT_AMBULATORY_CARE_PROVIDER_SITE_OTHER): Payer: Worker's Compensation | Admitting: Psychology

## 2015-04-06 DIAGNOSIS — F430 Acute stress reaction: Secondary | ICD-10-CM

## 2015-04-07 ENCOUNTER — Other Ambulatory Visit: Payer: Self-pay | Admitting: Family Medicine

## 2015-04-10 ENCOUNTER — Other Ambulatory Visit: Payer: Self-pay | Admitting: Family Medicine

## 2015-04-15 ENCOUNTER — Ambulatory Visit (INDEPENDENT_AMBULATORY_CARE_PROVIDER_SITE_OTHER): Payer: Worker's Compensation | Admitting: Psychology

## 2015-04-15 DIAGNOSIS — F430 Acute stress reaction: Secondary | ICD-10-CM | POA: Diagnosis not present

## 2015-04-23 ENCOUNTER — Ambulatory Visit: Payer: 59 | Admitting: Psychology

## 2015-04-24 ENCOUNTER — Ambulatory Visit (INDEPENDENT_AMBULATORY_CARE_PROVIDER_SITE_OTHER): Payer: Worker's Compensation | Admitting: Psychology

## 2015-04-24 DIAGNOSIS — F430 Acute stress reaction: Secondary | ICD-10-CM

## 2015-05-01 ENCOUNTER — Ambulatory Visit: Payer: Worker's Compensation | Admitting: Psychology

## 2015-05-08 ENCOUNTER — Ambulatory Visit: Payer: 59 | Admitting: Psychology

## 2015-05-15 ENCOUNTER — Ambulatory Visit: Payer: 59 | Admitting: Psychology

## 2015-05-29 ENCOUNTER — Other Ambulatory Visit: Payer: Self-pay | Admitting: Family Medicine

## 2015-05-29 NOTE — Telephone Encounter (Signed)
Last visit 03/31/15 Last refill 12/15/14 #20 0 refill

## 2015-05-29 NOTE — Telephone Encounter (Signed)
Refill Xanax once- avoid regular use. May refill Xopenex with 2 additional refills.

## 2015-07-07 ENCOUNTER — Other Ambulatory Visit: Payer: Self-pay | Admitting: *Deleted

## 2015-07-07 MED ORDER — ALPRAZOLAM 0.5 MG PO TABS: 0.5000 mg | ORAL_TABLET | Freq: Four times a day (QID) | ORAL | Status: DC

## 2015-07-07 NOTE — Telephone Encounter (Signed)
Refill once.  I would avoid regular use.

## 2015-08-03 ENCOUNTER — Other Ambulatory Visit: Payer: Self-pay | Admitting: Family Medicine

## 2015-08-29 ENCOUNTER — Other Ambulatory Visit: Payer: Self-pay | Admitting: Family Medicine

## 2015-10-26 ENCOUNTER — Encounter: Payer: Self-pay | Admitting: Family Medicine

## 2015-10-26 ENCOUNTER — Ambulatory Visit (INDEPENDENT_AMBULATORY_CARE_PROVIDER_SITE_OTHER): Payer: 59 | Admitting: Family Medicine

## 2015-10-26 VITALS — BP 110/82 | HR 102 | Temp 98.5°F | Ht 63.0 in

## 2015-10-26 DIAGNOSIS — J4541 Moderate persistent asthma with (acute) exacerbation: Secondary | ICD-10-CM | POA: Diagnosis not present

## 2015-10-26 DIAGNOSIS — J069 Acute upper respiratory infection, unspecified: Secondary | ICD-10-CM | POA: Diagnosis not present

## 2015-10-26 DIAGNOSIS — Z23 Encounter for immunization: Secondary | ICD-10-CM | POA: Diagnosis not present

## 2015-10-26 MED ORDER — PREDNISONE 20 MG PO TABS: ORAL_TABLET | ORAL | Status: DC

## 2015-10-26 NOTE — Patient Instructions (Addendum)
START the prednisone today  INSTRUCTIONS FOR UPPER RESPIRATORY INFECTION:  -plenty of rest and fluids  -nasal saline wash 2-3 times daily (use prepackaged nasal saline or bottled/distilled water if making your own)   -can use AFRIN nasal spray for drainage and nasal congestion - but do NOT use longer then 3-4 days  -can use tylenol (in no history of liver disease) or ibuprofen (if no history of kidney disease, bowel bleeding or significant heart disease) as directed for aches and sorethroat  -in the winter time, using a humidifier at night is helpful (please follow cleaning instructions)  -if you are taking a cough medication - use only as directed, may also try a teaspoon of honey to coat the throat and throat lozenges. If given a cough medication with codeine or hydrocodone or other narcotic please be advised that this contains a strong and  potentially addicting medication. Please follow instructions carefully, take as little as possible and only use AS NEEDED for severe cough. Discuss potential side effects with your pharmacy. Please do not drive or operate machinery while taking these types of medications. Please do not take other sedating medications, drugs or alcohol while taking this medication without discussing with your doctor.  -for sore throat, salt water gargles can help  -follow up if you have fevers, facial pain, tooth pain, difficulty breathing or are worsening or symptoms persist longer then expected  Upper Respiratory Infection, Adult An upper respiratory infection (URI) is also known as the common cold. It is often caused by a type of germ (virus). Colds are easily spread (contagious). You can pass it to others by kissing, coughing, sneezing, or drinking out of the same glass. Usually, you get better in 1 to 3  weeks.  However, the cough can last for even longer. HOME CARE   Only take medicine as told by your doctor. Follow instructions provided above.  Drink enough  water and fluids to keep your pee (urine) clear or pale yellow.  Get plenty of rest.  Return to work when your temperature is < 100 for 24 hours or as told by your doctor. You may use a face mask and wash your hands to stop your cold from spreading. GET HELP RIGHT AWAY IF:   After the first few days, you feel you are getting worse.  You have questions about your medicine.  You have chills, shortness of breath, or red spit (mucus).  You have pain in the face for more then 1-2 days, especially when you bend forward.  You have a fever, puffy (swollen) neck, pain when you swallow, or white spots in the back of your throat.  You have a bad headache, ear pain, sinus pain, or chest pain.  You have a high-pitched whistling sound when you breathe in and out (wheezing).  You cough up blood.  You have sore muscles or a stiff neck. MAKE SURE YOU:   Understand these instructions.  Will watch your condition.  Will get help right away if you are not doing well or get worse. Document Released: 05/30/2008 Document Revised: 03/05/2012 Document Reviewed: 03/19/2014 [facility] Patient Information 2015 [place], [place]. This information is not intended to replace advice given to you by your health care provider. Make sure you discuss any questions you have with your health care provider.  Get your flu shot when feeling better

## 2015-10-26 NOTE — Progress Notes (Signed)
Pre visit review using our clinic review tool, if applicable. No additional management support is needed unless otherwise documented below in the visit note. Patient declined weight measurement today. 

## 2015-10-26 NOTE — Progress Notes (Signed)
HPI:  Amy [name] is a 39 yo F patient of Dr. Caryl Never with a PMH sig for Anxiety and asthma here for an acute visit for:  URI: -started: 3 days ago -symptoms:nasal congestion, sore throat, cough, drainage and now with some mild chest tightness and SOB and has required her albuterol several times per day the last 2 days -denies:fever, NVD, tooth pain, sinus pain, fatigue, increased mucus production -sick contacts/travel/risks: denies flu exposure, tick exposure or or Ebola risks, around others with allergies/sinus issues -Hx of: allergies, asthma, anxiety - uses advair daily, duoneb and/or xopenex prn when sick  ROS: See pertinent positives and negatives per HPI.  Past Medical History  Diagnosis Date  . Asthma   . Anxiety     Past Surgical History  Procedure Laterality Date  . Tubal ligation    . Bladder surgery  2010  . Shoulder surgery x 4      Family History  Problem Relation Age of Onset  . Diabetes Paternal Grandfather     Social History   Social History  . Marital Status: Married    Spouse Name: N/A  . Number of Children: N/A  . Years of Education: N/A   Social History Main Topics  . Smoking status: Current Every Day Smoker -- 0.50 packs/day for 7 years    Types: Cigarettes  . Smokeless tobacco: None  . Alcohol Use: No  . Drug Use: No  . Sexual Activity: Not Currently   Other Topics Concern  . None   Social History Narrative     Current outpatient prescriptions:  .  ADVAIR DISKUS 250-50 MCG/DOSE AEPB, INHALE 1 PUFF INTO THE LUNGS AT BEDTIME, Disp: 60 each, Rfl: 2 .  ALPRAZolam (XANAX) 0.5 MG tablet, Take 1 tablet (0.5 mg total) by mouth 4 (four) times daily. Avoid regular use, Disp: 20 tablet, Rfl: 0 .  cetirizine (ZYRTEC) 10 MG tablet, Take 10 mg by mouth daily., Disp: , Rfl:  .  ibuprofen (ADVIL,MOTRIN) 600 MG tablet, Take 1 tablet (600 mg total) by mouth every 8 (eight) hours as needed. (Patient taking differently: Take 600 mg by mouth  every 8 (eight) hours as needed (for pain). ), Disp: 15 tablet, Rfl: 0 .  ipratropium-albuterol (DUONEB) 0.5-2.5 (3) MG/3ML SOLN, USE BY NEBULIZATION EVERY 6 HOURS AS NEEDED, Disp: 90 mL, Rfl: 3 .  Multiple Vitamin (MULTIVITAMIN WITH MINERALS) TABS tablet, Take 1 tablet by mouth daily., Disp: , Rfl:  .  XOPENEX HFA 45 MCG/ACT inhaler, INHALE 1-2 PUFFS INTO THE LUNGS EVERY 4 HOURS AS NEEDED FOR WHEEZING., Disp: 15 Inhaler, Rfl: 3 .  predniSONE (DELTASONE) 20 MG tablet, (2 tablets) daily for 4 days, then  (1 tablet daily for 3 days), Disp: 11 tablet, Rfl: 0  EXAM:  Filed Vitals:   10/26/15 0928  BP: 110/82  Pulse: 102  Temp: 98.5 F (36.9 C)    There is no weight on file to calculate BMI.  GENERAL: vitals reviewed and listed above, alert, oriented, appears well hydrated and in no acute distress  HEENT: atraumatic, conjunttiva clear, no obvious abnormalities on inspection of external nose and ears, normal appearance of ear canals and TMs, clear nasal congestion, mild post oropharyngeal erythema with PND, no tonsillar edema or exudate, no sinus TTP  NECK: no obvious masses on inspection  LUNGS: scattered wheezes, no rhonchi, no signs of respiratory distress, O2 96% on RA  CV: HRRR, no peripheral edema  MS: moves all extremities without noticeable abnormality  PSYCH: pleasant and cooperative, no obvious depression or anxiety  ASSESSMENT AND PLAN:  Discussed the following assessment and plan:  Acute upper respiratory infection  Asthma with acute exacerbation, moderate persistent  -We discussed potential etiologies, with VURI with an asthma exacerbation being most likely, and advised prednisone, supportive care and monitoring. We discussed treatment side effects, likely course, antibiotic misuse, transmission, and signs of developing a serious illness. -of course, we advised to return or notify a doctor immediately if symptoms worsen or persist or new concerns arise.    Patient Instructions  START the prednisone today  INSTRUCTIONS FOR UPPER RESPIRATORY INFECTION:  -plenty of rest and fluids  -nasal saline wash 2-3 times daily (use prepackaged nasal saline or bottled/distilled water if making your own)   -can use AFRIN nasal spray for drainage and nasal congestion - but do NOT use longer then 3-4 days  -can use tylenol (in no history of liver disease) or ibuprofen (if no history of kidney disease, bowel bleeding or significant heart disease) as directed for aches and sorethroat  -in the winter time, using a humidifier at night is helpful (please follow cleaning instructions)  -if you are taking a cough medication - use only as directed, may also try a teaspoon of honey to coat the throat and throat lozenges. If given a cough medication with codeine or hydrocodone or other narcotic please be advised that this contains a strong and  potentially addicting medication. Please follow instructions carefully, take as little as possible and only use AS NEEDED for severe cough. Discuss potential side effects with your pharmacy. Please do not drive or operate machinery while taking these types of medications. Please do not take other sedating medications, drugs or alcohol while taking this medication without discussing with your doctor.  -for sore throat, salt water gargles can help  -follow up if you have fevers, facial pain, tooth pain, difficulty breathing or are worsening or symptoms persist longer then expected  Upper Respiratory Infection, Adult An upper respiratory infection (URI) is also known as the common cold. It is often caused by a type of germ (virus). Colds are easily spread (contagious). You can pass it to others by kissing, coughing, sneezing, or drinking out of the same glass. Usually, you get better in 1 to 3  weeks.  However, the cough can last for even longer. HOME CARE   Only take medicine as told by your doctor. Follow instructions provided  above.  Drink enough water and fluids to keep your pee (urine) clear or pale yellow.  Get plenty of rest.  Return to work when your temperature is < 100 for 24 hours or as told by your doctor. You may use a face mask and wash your hands to stop your cold from spreading. GET HELP RIGHT AWAY IF:   After the first few days, you feel you are getting worse.  You have questions about your medicine.  You have chills, shortness of breath, or red spit (mucus).  You have pain in the face for more then 1-2 days, especially when you bend forward.  You have a fever, puffy (swollen) neck, pain when you swallow, or white spots in the back of your throat.  You have a bad headache, ear pain, sinus pain, or chest pain.  You have a high-pitched whistling sound when you breathe in and out (wheezing).  You cough up blood.  You have sore muscles or a stiff neck. MAKE SURE YOU:   Understand these instructions.  Will watch your condition.  Will get help right away if you are not doing well or get worse. Document Released: 05/30/2008 Document Revised: 03/05/2012 Document Reviewed: 03/19/2014 [facility] Patient Information 2015 [place], [place]. This information is not intended to replace advice given to you by your health care provider. Make sure you discuss any questions you have with your health care provider.  Get your flu shot when feeling better     Kriste BasqueKIM, HANNAH R.

## 2015-11-13 ENCOUNTER — Other Ambulatory Visit: Payer: Self-pay | Admitting: Family Medicine

## 2015-11-13 MED ORDER — IPRATROPIUM-ALBUTEROL 0.5-2.5 (3) MG/3ML IN SOLN: RESPIRATORY_TRACT | Status: DC

## 2015-11-14 ENCOUNTER — Other Ambulatory Visit: Payer: Self-pay | Admitting: Family Medicine

## 2015-11-25 ENCOUNTER — Encounter (HOSPITAL_COMMUNITY): Payer: Self-pay | Admitting: Emergency Medicine

## 2015-11-25 ENCOUNTER — Emergency Department (HOSPITAL_COMMUNITY): Payer: 59

## 2015-11-25 ENCOUNTER — Emergency Department (HOSPITAL_COMMUNITY)
Admission: EM | Admit: 2015-11-25 | Discharge: 2015-11-25 | Disposition: A | Discharge status: Home / Self Care | Payer: 59 | Attending: Emergency Medicine | Admitting: Emergency Medicine

## 2015-11-25 DIAGNOSIS — F17210 Nicotine dependence, cigarettes, uncomplicated: Secondary | ICD-10-CM | POA: Insufficient documentation

## 2015-11-25 DIAGNOSIS — F419 Anxiety disorder, unspecified: Secondary | ICD-10-CM | POA: Diagnosis not present

## 2015-11-25 DIAGNOSIS — J45909 Unspecified asthma, uncomplicated: Secondary | ICD-10-CM | POA: Diagnosis present

## 2015-11-25 DIAGNOSIS — Z3202 Encounter for pregnancy test, result negative: Secondary | ICD-10-CM | POA: Diagnosis not present

## 2015-11-25 DIAGNOSIS — J45901 Unspecified asthma with (acute) exacerbation: Secondary | ICD-10-CM | POA: Diagnosis not present

## 2015-11-25 DIAGNOSIS — Z79899 Other long term (current) drug therapy: Secondary | ICD-10-CM | POA: Diagnosis not present

## 2015-11-25 LAB — CBC WITH DIFFERENTIAL/PLATELET
BASOS ABS: 0 10*3/uL (ref 0.0–0.1)
BASOS PCT: 0 %
EOS ABS: 0 10*3/uL (ref 0.0–0.7)
Eosinophils Relative: 0 %
HEMATOCRIT: 41.6 % (ref 36.0–46.0)
HEMOGLOBIN: 13.8 g/dL (ref 12.0–15.0)
Lymphocytes Relative: 3 %
Lymphs Abs: 0.5 10*3/uL — ABNORMAL LOW (ref 0.7–4.0)
MCH: 29.7 pg (ref 26.0–34.0)
MCHC: 33.2 g/dL (ref 30.0–36.0)
MCV: 89.7 fL (ref 78.0–100.0)
Monocytes Absolute: 0.1 10*3/uL (ref 0.1–1.0)
Monocytes Relative: 0 %
NEUTROS ABS: 15.1 10*3/uL — AB (ref 1.7–7.7)
NEUTROS PCT: 97 %
Platelets: 414 10*3/uL — ABNORMAL HIGH (ref 150–400)
RBC: 4.64 MIL/uL (ref 3.87–5.11)
RDW: 13.4 % (ref 11.5–15.5)
WBC: 15.7 10*3/uL — ABNORMAL HIGH (ref 4.0–10.5)

## 2015-11-25 LAB — BASIC METABOLIC PANEL
ANION GAP: 12 (ref 5–15)
BUN: 8 mg/dL (ref 6–20)
CALCIUM: 9.1 mg/dL (ref 8.9–10.3)
CHLORIDE: 108 mmol/L (ref 101–111)
CO2: 17 mmol/L — AB (ref 22–32)
Creatinine, Ser: 0.97 mg/dL (ref 0.44–1.00)
GFR calc non Af Amer: >60 mL/min (ref >60)
Glucose, Bld: 196 mg/dL — ABNORMAL HIGH (ref 65–99)
Potassium: 3.8 mmol/L (ref 3.5–5.1)
SODIUM: 137 mmol/L (ref 135–145)

## 2015-11-25 LAB — HCG, SERUM, QUALITATIVE: PREG SERUM: NEGATIVE

## 2015-11-25 MED ORDER — PREDNISONE 50 MG PO TABS
50.0000 mg | ORAL_TABLET | Freq: Once | ORAL | Status: AC
  Administered 2015-11-25: 50 mg via ORAL
  Filled 2015-11-25: qty 1

## 2015-11-25 MED ORDER — IPRATROPIUM-ALBUTEROL 0.5-2.5 (3) MG/3ML IN SOLN: 3.0000 mL | RESPIRATORY_TRACT | Status: DC | PRN

## 2015-11-25 MED ORDER — LORAZEPAM 1 MG PO TABS
1.0000 mg | ORAL_TABLET | Freq: Once | ORAL | Status: DC
  Filled 2015-11-25: qty 1

## 2015-11-25 MED ORDER — PREDNISONE 10 MG PO TABS: 40.0000 mg | ORAL_TABLET | Freq: Every day | ORAL | Status: DC

## 2015-11-25 MED ORDER — IPRATROPIUM-ALBUTEROL 0.5-2.5 (3) MG/3ML IN SOLN
3.0000 mL | Freq: Once | RESPIRATORY_TRACT | Status: AC
  Administered 2015-11-25: 3 mL via RESPIRATORY_TRACT
  Filled 2015-11-25: qty 3

## 2015-11-25 MED ORDER — SODIUM CHLORIDE 0.9 % IV BOLUS (SEPSIS)
1000.0000 mL | Freq: Once | INTRAVENOUS | Status: AC
  Administered 2015-11-25: 1000 mL via INTRAVENOUS

## 2015-11-25 NOTE — ED Notes (Signed)
UNABLE TO COLLECT LABS AT THIS TIME PATIENT IS IN XRAY 

## 2015-11-25 NOTE — ED Notes (Signed)
Per pt, increased asthma symptoms since 10 pm yesterday-SOB

## 2015-11-25 NOTE — ED Notes (Signed)
RT called

## 2015-11-25 NOTE — ED Provider Notes (Signed)
CSN: [id number]     Arrival date & time 11/25/15  1059 History   First MD Initiated Contact with Patient 11/25/15 1120     Chief Complaint  Patient presents with  . Asthma     (Consider location/radiation/quality/duration/timing/severity/associated sxs/prior Treatment) HPI  39 year old female who presents with shortness of breath. History of asthma and anxiety. States that her family had recently gotten new [place] pigs , which she thinks  Is flaring up her asthma. Over the course of the past day she has had increasing shortness of breath, not improved with home inhalers. Has had mild cough not productive sputum. Has not had any fevers, congestion, sore throat, runny nose, nausea or vomiting, abdominal pain, or chest pain. No leg swelling or pain. No recent traveling or recent immobilization. Does not take birth control.    Past Medical History  Diagnosis Date  . Asthma   . Anxiety    Past Surgical History  Procedure Laterality Date  . Tubal ligation    . Bladder surgery  2010  . Shoulder surgery x 4     Family History  Problem Relation Age of Onset  . Diabetes Paternal Grandfather    Social History  Substance Use Topics  . Smoking status: Current Every Day Smoker -- 0.50 packs/day for 7 years    Types: Cigarettes  . Smokeless tobacco: None  . Alcohol Use: No   OB History    No data available     Review of Systems 10/14 systems reviewed and are negative other than those stated in the HPI   Allergies  Hydromorphone hcl  Home Medications   Prior to Admission medications   Medication Sig Start Date End Date Taking? Authorizing Provider  ADVAIR DISKUS 250-50 MCG/DOSE AEPB INHALE 1 PUFF INTO THE LUNGS AT BEDTIME 11/14/15  Yes Kristian Covey, MD  albuterol (PROVENTIL HFA;VENTOLIN HFA) 108 (90 BASE) MCG/ACT inhaler Inhale 2 puffs into the lungs every 6 (six) hours as needed for wheezing or shortness of breath.   Yes Historical Provider, MD  ALPRAZolam Prudy Feeler) 0.5 MG  tablet Take 1 tablet (0.5 mg total) by mouth 4 (four) times daily. Avoid regular use 07/07/15  Yes Kristian Covey, MD  cetirizine (ZYRTEC) 10 MG tablet Take 10 mg by mouth daily as needed for allergies.    Yes Historical Provider, MD  ibuprofen (ADVIL,MOTRIN) 200 MG tablet Take 400-800 mg by mouth every 6 (six) hours as needed for headache or moderate pain.   Yes Historical Provider, MD  ipratropium-albuterol (DUONEB) 0.5-2.5 (3) MG/3ML SOLN USE BY NEBULIZATION EVERY 6 HOURS AS NEEDED 11/13/15  Yes Kristian Covey, MD  Multiple Vitamin (MULTIVITAMIN WITH MINERALS) TABS tablet Take 1 tablet by mouth daily.   Yes Historical Provider, MD  nicotine (NICODERM CQ - DOSED IN MG/24 HOURS) 21 mg/24hr patch Place 21 mg onto the skin daily.   Yes Historical Provider, MD  XOPENEX HFA 45 MCG/ACT inhaler INHALE 1-2 PUFFS INTO THE LUNGS EVERY 4 HOURS AS NEEDED FOR WHEEZING. 09/01/15  Yes Kristian Covey, MD  predniSONE (DELTASONE) 10 MG tablet Take 4 tablets (40 mg total) by mouth daily. 11/26/15   Lavera Guise, MD  predniSONE (DELTASONE) 20 MG tablet (2 tablets) daily for 4 days, then  (1 tablet daily for 3 days) 10/26/15   Terressa Koyanagi, DO   BP 139/79 mmHg  Pulse 109  Resp 26  SpO2 97%  LMP 11/25/2015 Physical Exam Physical Exam  Nursing note and vitals reviewed.  Constitutional: Well developed, well nourished, non-toxic, appears anxious and in mild respiratory distress Head: Normocephalic and atraumatic.  Mouth/Throat: Oropharynx is clear and moist.  Neck: Normal range of motion. Neck supple.  Cardiovascular: Normal rate and regular rhythm.   Pulmonary/Chest: Tachypnea. Breathing leaning forward, but when speaking, breathing slows down and she speaks in full sentences. Scattered wheezing with moderate air movement on auscultation. Abdominal: Soft. There is no tenderness. There is no rebound and no guarding.  Musculoskeletal: Normal range of motion. No edema. Neurological: Alert, no  facial droop, fluent speech, moves all extremities symmetrically Skin: Skin is warm and dry.  Psychiatric: Cooperative  ED Course  Procedures (including critical care time) Labs Review Labs Reviewed  CBC WITH DIFFERENTIAL/PLATELET - Abnormal; Notable for the following:    WBC 15.7 (*)    Platelets 414 (*)    Neutro Abs 15.1 (*)    Lymphs Abs 0.5 (*)    All other components within normal limits  BASIC METABOLIC PANEL - Abnormal; Notable for the following:    CO2 17 (*)    Glucose, Bld 196 (*)    All other components within normal limits  HCG, SERUM, QUALITATIVE    Imaging Review Dg Chest 2 View  11/25/2015  CLINICAL DATA:  Shortness of breath, cough. EXAM: CHEST  2 VIEW COMPARISON:  March 12, 2015. FINDINGS: The heart size and mediastinal contours are within normal limits. Both lungs are clear. No pneumothorax or pleural effusion is noted. The visualized skeletal structures are unremarkable. IMPRESSION: No active cardiopulmonary disease. Electronically Signed   By: Lupita RaiderJames  Green Jr, M.D.   On: 11/25/2015 12:07   I have personally reviewed and evaluated these images and lab results as part of my medical decision-making.   EKG Interpretation   Date/Time:  Wednesday November 25 2015 11:10:23 EST Ventricular Rate:  115 PR Interval:  116 QRS Duration: 94 QT Interval:  338 QTC Calculation: 467 R Axis:   96 Text Interpretation:  Sinus tachycardia Borderline right axis deviation No  significant change since last tracing Confirmed by Joshue Badal MD, Jamaury Gumz ([id number]) on  11/25/2015 11:23:46 AM      MDM   Final diagnoses:  Asthma exacerbation   In short, this is a 39 year old female with history of anxiety and asthma who presents with shortness of breath. Had received a breathing treatment by EMS, and on arrival  Has labored breathing , and is leaning forward , and with moderate air movement on lung exam with scattered wheezing.  Oxygenating normally, and when speaking, has slowing of her  breathing , looks more comfortable and without conversational dyspnea.  I feel that anxiety is a component of her difficulty breathing today, and she also agrees with this. He is given an additional breathing treatment, prednisone, and 1 mg of oral Ativan. Reevaluation, she appears very comfortable, continues to speak in full sentences, and with lungs that are clear to auscultation. Says that her symptoms had fully resolved. Her EKG reveals sinus tachycardia but without evidence of acute ischemia. Basic blood work shows no major metabolic or electrolyte derangements. She is not pregnant. CXR without acute cardiopulmonary processes. Observed in ED without need for repeat breathing treatments. Tachycardia improves with treatment and IVF. No suspicion for PE as lack of risk factors and symptoms consistent with typical asthma exacerbation for her. Given course of steroids and discussed supportive care instructions for home. Strict return and follow-up instructions reviewed. She expressed understanding of all discharge instructions and felt comfortable with the plan of  care.     Lavera Guise, MD 11/25/15 [phone]

## 2015-11-25 NOTE — Discharge Instructions (Signed)
Return without fail for worsening symptoms, including worsening pain, difficulty breathing, or any other symptoms concerning to you. Please follow-up with your primary care provider. Use your albuterol every 4 hours scheduled for first day, then as needed. Take steroids as prescribed   Asthma, Acute Bronchospasm Acute bronchospasm caused by asthma is also referred to as an asthma attack. Bronchospasm means your air passages become narrowed. The narrowing is caused by inflammation and tightening of the muscles in the air tubes (bronchi) in your lungs. This can make it hard to breathe or cause you to wheeze and cough. CAUSES Possible triggers are:  Animal dander from the skin, hair, or feathers of animals.  Dust mites contained in house dust.  Cockroaches.  Pollen from trees or grass.  Mold.  Cigarette or tobacco smoke.  Air pollutants such as dust, household cleaners, hair sprays, aerosol sprays, paint fumes, strong chemicals, or strong odors.  Cold air or weather changes. Cold air may trigger inflammation. Winds increase molds and pollens in the air.  Strong emotions such as crying or laughing hard.  Stress.  Certain medicines such as aspirin or beta-blockers.  Sulfites in foods and drinks, such as dried fruits and wine.  Infections or inflammatory conditions, such as a flu, cold, or inflammation of the nasal membranes (rhinitis).  Gastroesophageal reflux disease (GERD). GERD is a condition where stomach acid backs up into your esophagus.  Exercise or strenuous activity. SIGNS AND SYMPTOMS   Wheezing.  Excessive coughing, particularly at night.  Chest tightness.  Shortness of breath. DIAGNOSIS  Your health care provider will ask you about your medical history and perform a physical exam. A chest X-ray or blood testing may be performed to look for other causes of your symptoms or other conditions that may have triggered your asthma attack. TREATMENT  Treatment is aimed  at reducing inflammation and opening up the airways in your lungs. Most asthma attacks are treated with inhaled medicines. These include quick relief or rescue medicines (such as bronchodilators) and controller medicines (such as inhaled corticosteroids). These medicines are sometimes given through an inhaler or a nebulizer. Systemic steroid medicine taken by mouth or given through an IV tube also can be used to reduce the inflammation when an attack is moderate or severe. Antibiotic medicines are only used if a bacterial infection is present.  HOME CARE INSTRUCTIONS   Rest.  Drink plenty of liquids. This helps the mucus to remain thin and be easily coughed up. Only use caffeine in moderation and do not use alcohol until you have recovered from your illness.  Do not smoke. Avoid being exposed to secondhand smoke.  You play a critical role in keeping yourself in good health. Avoid exposure to things that cause you to wheeze or to have breathing problems.  Keep your medicines up-to-date and available. Carefully follow your health care provider's treatment plan.  Take your medicine exactly as prescribed.  When pollen or pollution is bad, keep windows closed and use an air conditioner or go to places with air conditioning.  Asthma requires careful medical care. See your health care provider for a follow-up as advised. If you are more than [redacted] weeks pregnant and you were prescribed any new medicines, let your obstetrician know about the visit and how you are doing. Follow up with your health care provider as directed.  After you have recovered from your asthma attack, make an appointment with your outpatient doctor to talk about ways to reduce the likelihood of future attacks.  If you do not have a doctor who manages your asthma, make an appointment with a primary care doctor to discuss your asthma. SEEK IMMEDIATE MEDICAL CARE IF:   You are getting worse.  You have trouble breathing. If severe,  call your local emergency services (911 in the [place]  You develop chest pain or discomfort.  You are vomiting.  You are not able to keep fluids down.  You are coughing up yellow, green, brown, or bloody sputum.  You have a fever and your symptoms suddenly get worse.  You have trouble swallowing. MAKE SURE YOU:   Understand these instructions.  Will watch your condition.  Will get help right away if you are not doing well or get worse.   This information is not intended to replace advice given to you by your health care provider. Make sure you discuss any questions you have with your health care provider.   Document Released: 03/29/2007 Document Revised: 12/17/2013 Document Reviewed: 06/19/2013 Elsevier Interactive Patient Education [employer]

## 2016-01-06 ENCOUNTER — Other Ambulatory Visit: Payer: Self-pay | Admitting: Family Medicine

## 2016-01-07 NOTE — Telephone Encounter (Signed)
Last fill was #20, 0rf Last appt 03/31/2015--anxiety was discussed No pending appt Please advise.

## 2016-01-07 NOTE — Telephone Encounter (Signed)
Refill once only .. Avoid regular use. 

## 2016-01-25 ENCOUNTER — Encounter: Payer: Self-pay | Admitting: Family Medicine

## 2016-01-25 ENCOUNTER — Ambulatory Visit (INDEPENDENT_AMBULATORY_CARE_PROVIDER_SITE_OTHER): Payer: 59 | Admitting: Family Medicine

## 2016-01-25 VITALS — BP 116/76 | HR 116 | Temp 98.7°F | Ht 63.0 in

## 2016-01-25 DIAGNOSIS — H11442 Conjunctival cysts, left eye: Secondary | ICD-10-CM | POA: Diagnosis not present

## 2016-01-25 DIAGNOSIS — H109 Unspecified conjunctivitis: Secondary | ICD-10-CM

## 2016-01-25 MED ORDER — ERYTHROMYCIN 5 MG/GM OP OINT: 1.0000 "application " | TOPICAL_OINTMENT | Freq: Every day | OPHTHALMIC | Status: DC

## 2016-01-25 NOTE — Progress Notes (Signed)
HPI:  Acute visit for L eye irritation: -has allergies -developed itchy watery eye this morning and then had small bubble which is now improving -denies: pain, trauma, vision changes, fevers, malaise or pus -she has an eye doctor  ROS: See pertinent positives and negatives per HPI.  Past Medical History  Diagnosis Date  . Asthma   . Anxiety     Past Surgical History  Procedure Laterality Date  . Tubal ligation    . Bladder surgery  2010  . Shoulder surgery x 4      Family History  Problem Relation Age of Onset  . Diabetes Paternal Grandfather     Social History   Social History  . Marital Status: Married    Spouse Name: N/A  . Number of Children: N/A  . Years of Education: N/A   Social History Main Topics  . Smoking status: Current Every Day Smoker -- 0.50 packs/day for 7 years    Types: Cigarettes  . Smokeless tobacco: None  . Alcohol Use: No  . Drug Use: No  . Sexual Activity: Not Currently   Other Topics Concern  . None   Social History Narrative     Current outpatient prescriptions:  .  ADVAIR DISKUS 250-50 MCG/DOSE AEPB, INHALE 1 PUFF INTO THE LUNGS AT BEDTIME, Disp: 60 each, Rfl: 2 .  albuterol (PROVENTIL HFA;VENTOLIN HFA) 108 (90 BASE) MCG/ACT inhaler, Inhale 2 puffs into the lungs every 6 (six) hours as needed for wheezing or shortness of breath., Disp: , Rfl:  .  ALPRAZolam (XANAX) 0.5 MG tablet, TAKE 1 TABLET BY MOUTH 4 TIMES A DAY AS NEEDED (AVOID REGULAR USE), Disp: 20 tablet, Rfl: 0 .  cetirizine (ZYRTEC) 10 MG tablet, Take 10 mg by mouth daily as needed for allergies. , Disp: , Rfl:  .  ibuprofen (ADVIL,MOTRIN) 200 MG tablet, Take 400-800 mg by mouth every 6 (six) hours as needed for headache or moderate pain., Disp: , Rfl:  .  ipratropium-albuterol (DUONEB) 0.5-2.5 (3) MG/3ML SOLN, USE BY NEBULIZATION EVERY 6 HOURS AS NEEDED, Disp: 90 mL, Rfl: 3 .  Multiple Vitamin (MULTIVITAMIN WITH MINERALS) TABS tablet, Take 1 tablet by mouth daily.,  Disp: , Rfl:  .  nicotine (NICODERM CQ - DOSED IN MG/24 HOURS) 21 mg/24hr patch, Place 21 mg onto the skin daily., Disp: , Rfl:  .  XOPENEX HFA 45 MCG/ACT inhaler, INHALE 1-2 PUFFS INTO THE LUNGS EVERY 4 HOURS AS NEEDED FOR WHEEZING., Disp: 15 Inhaler, Rfl: 3 .  erythromycin (ROMYCIN) ophthalmic ointment, Place 1 application into the left eye at bedtime., Disp: 3.5 g, Rfl: 0  EXAM:  Filed Vitals:   01/25/16 0917  BP: 116/76  Pulse: 116  Temp: 98.7 F (37.1 C)    There is no weight on file to calculate BMI.  GENERAL: vitals reviewed and listed above, alert, oriented, appears well hydrated and in no acute distress  HEENT: atraumatic, conjunctiva mildly erythematous bilaterally with clear discharge bilaterally, clear bubble L lat sclera, no obvious abnormalities on inspection of external nose and ears  NECK: no obvious masses on inspection  MS: moves all extremities without noticeable abnormality  PSYCH: pleasant and cooperative, no obvious depression or anxiety  ASSESSMENT AND PLAN:  Discussed the following assessment and plan:  Bilateral conjunctivitis  Conjunctival cyst, left  -compresses, eye saline, abx ointment at night and follow up with eye doctor advised to ensure resolution and if any worsening -Patient advised to return or notify a doctor immediately if symptoms  worsen or persist or new concerns arise.  There are no Patient Instructions on file for this visit.   Kriste Basque R.

## 2016-01-25 NOTE — Progress Notes (Signed)
Pre visit review using our clinic review tool, if applicable. No additional management support is needed unless otherwise documented below in the visit note. Patient declines weight measurement today. 

## 2016-02-20 ENCOUNTER — Other Ambulatory Visit: Payer: Self-pay | Admitting: Family Medicine

## 2016-04-07 IMAGING — CR DG HIP (WITH OR WITHOUT PELVIS) 2-3V*R*
3 series · 3 of 3 positions shown · non-contrast
Comparison: None.

CLINICAL DATA: Tripped over dog and fell downstairs this evening.
Hip pain.

EXAM:
RIGHT HIP (WITH PELVIS) 2-3 VIEWS

[t pelvis ap]
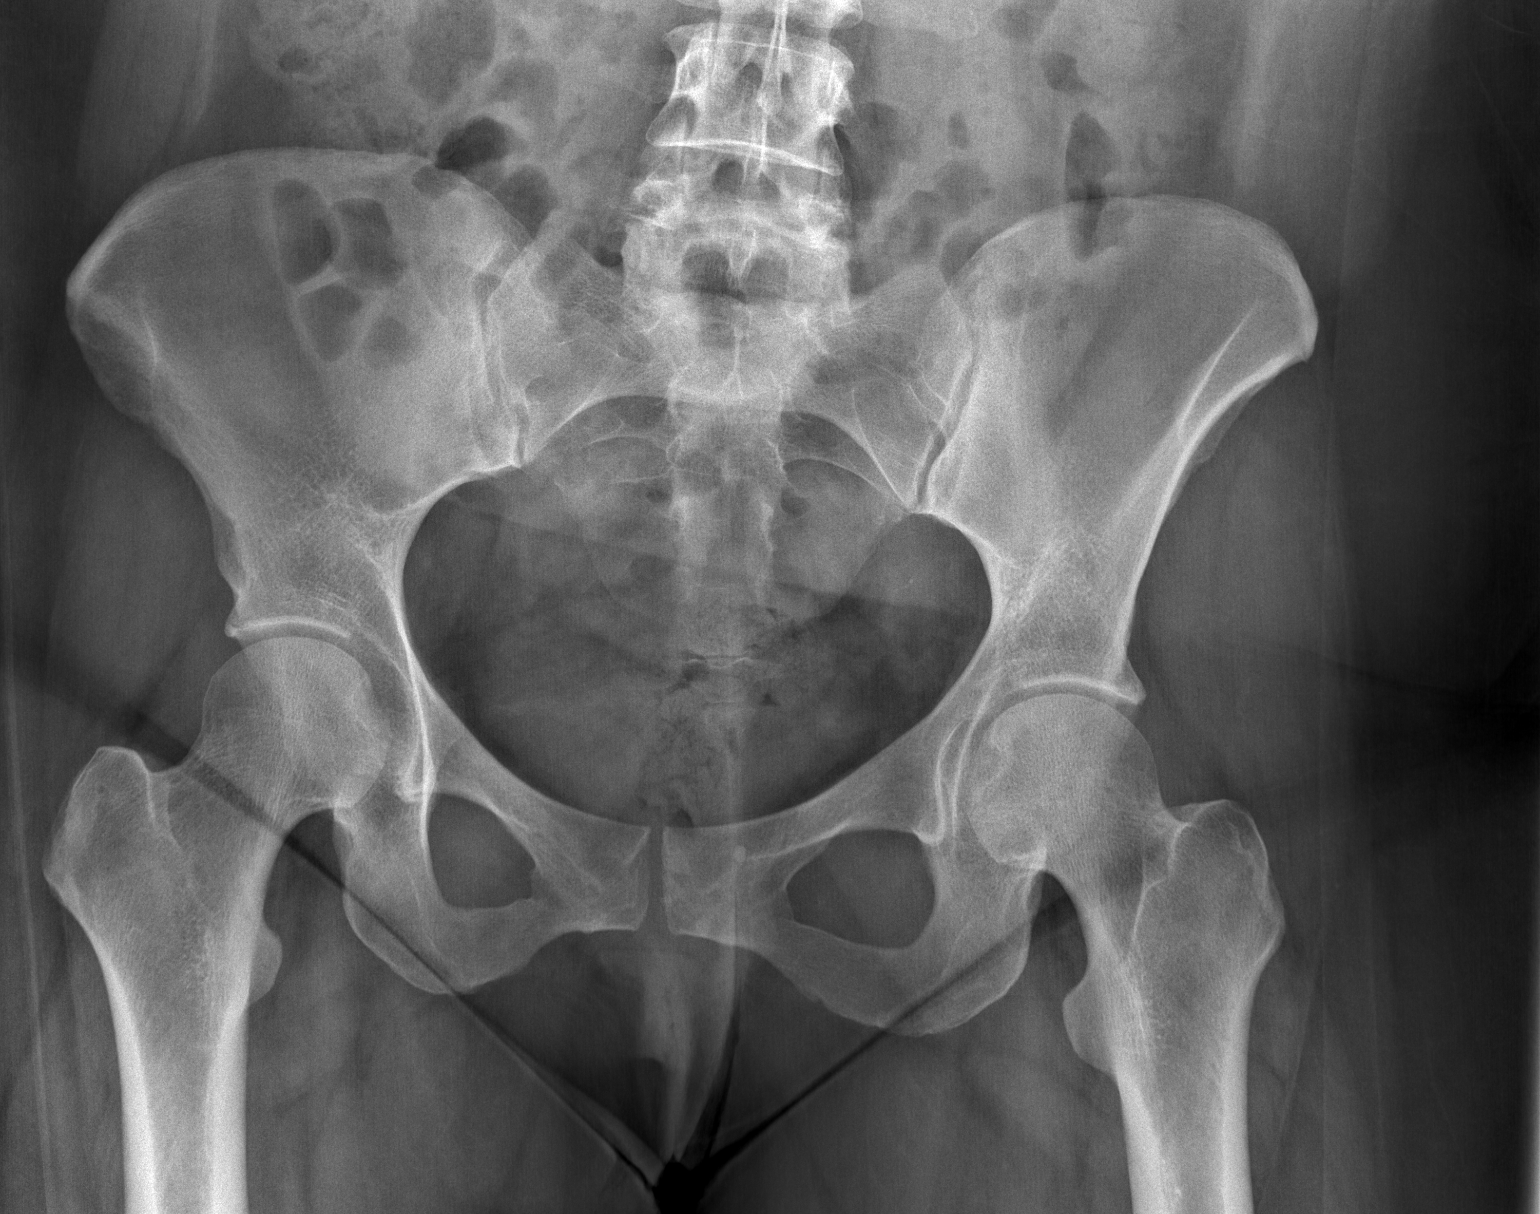

[t hip ap right]
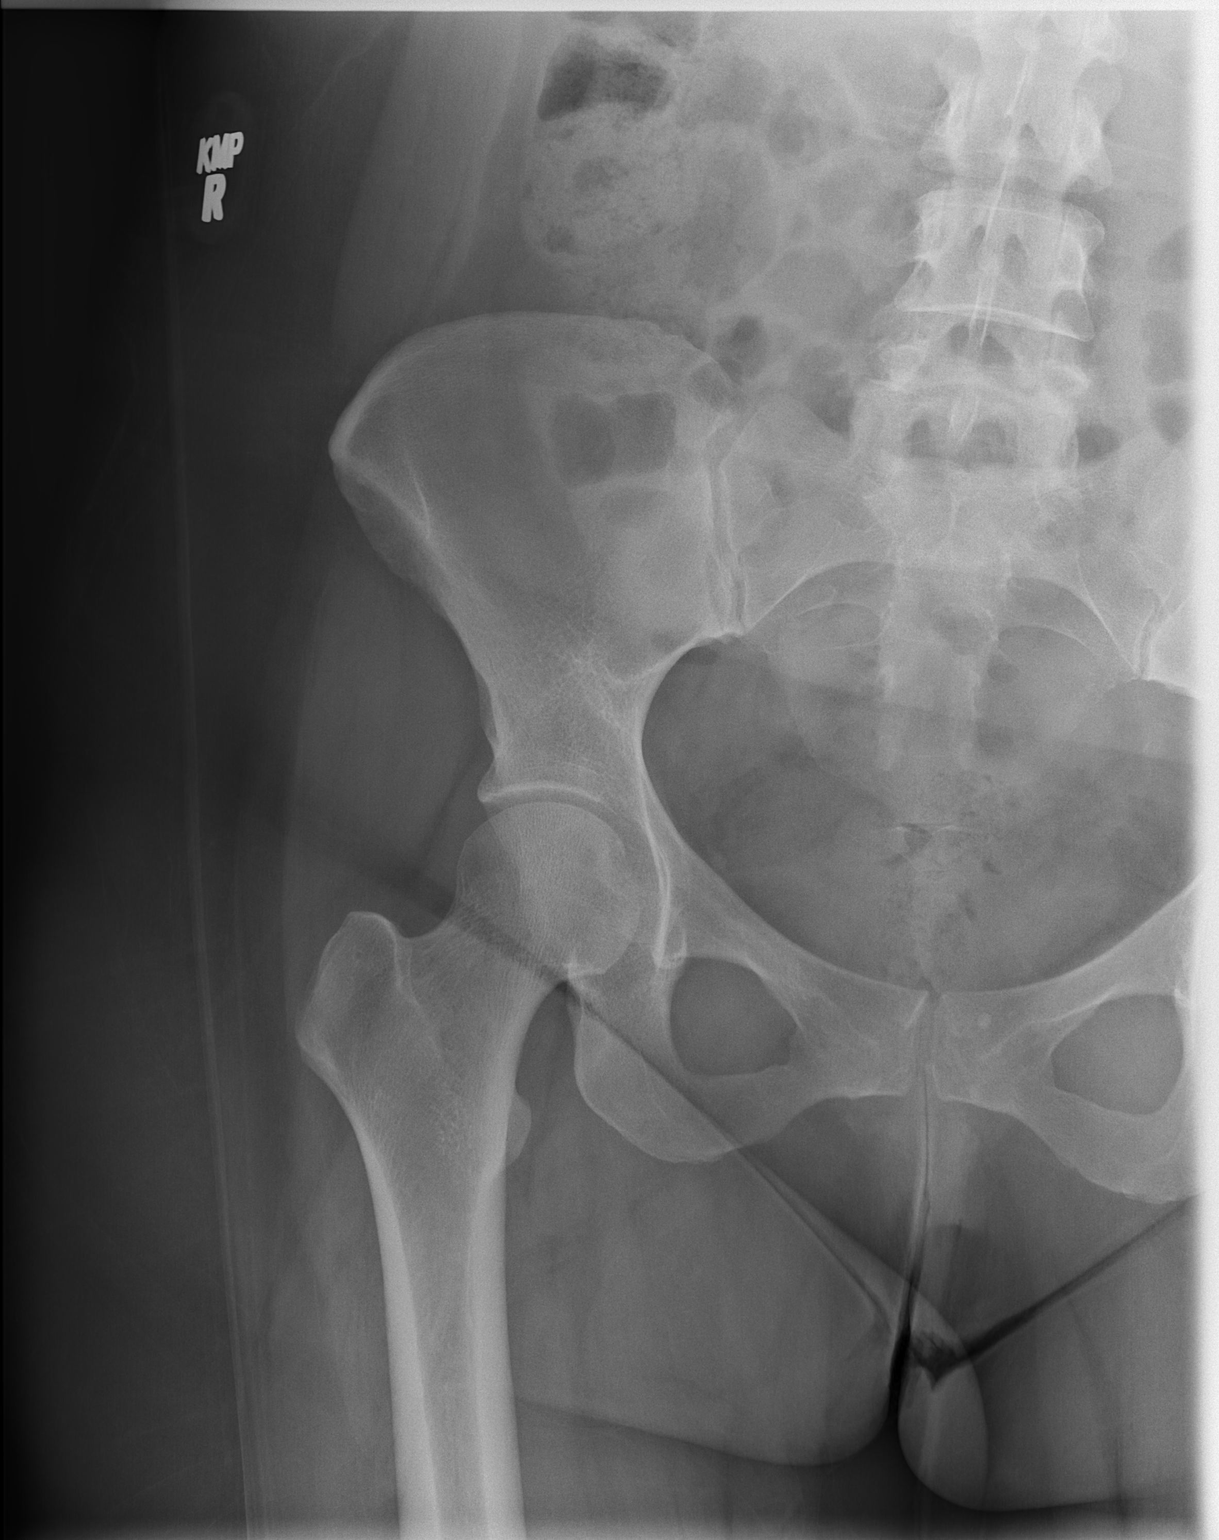

[w hip lat right]
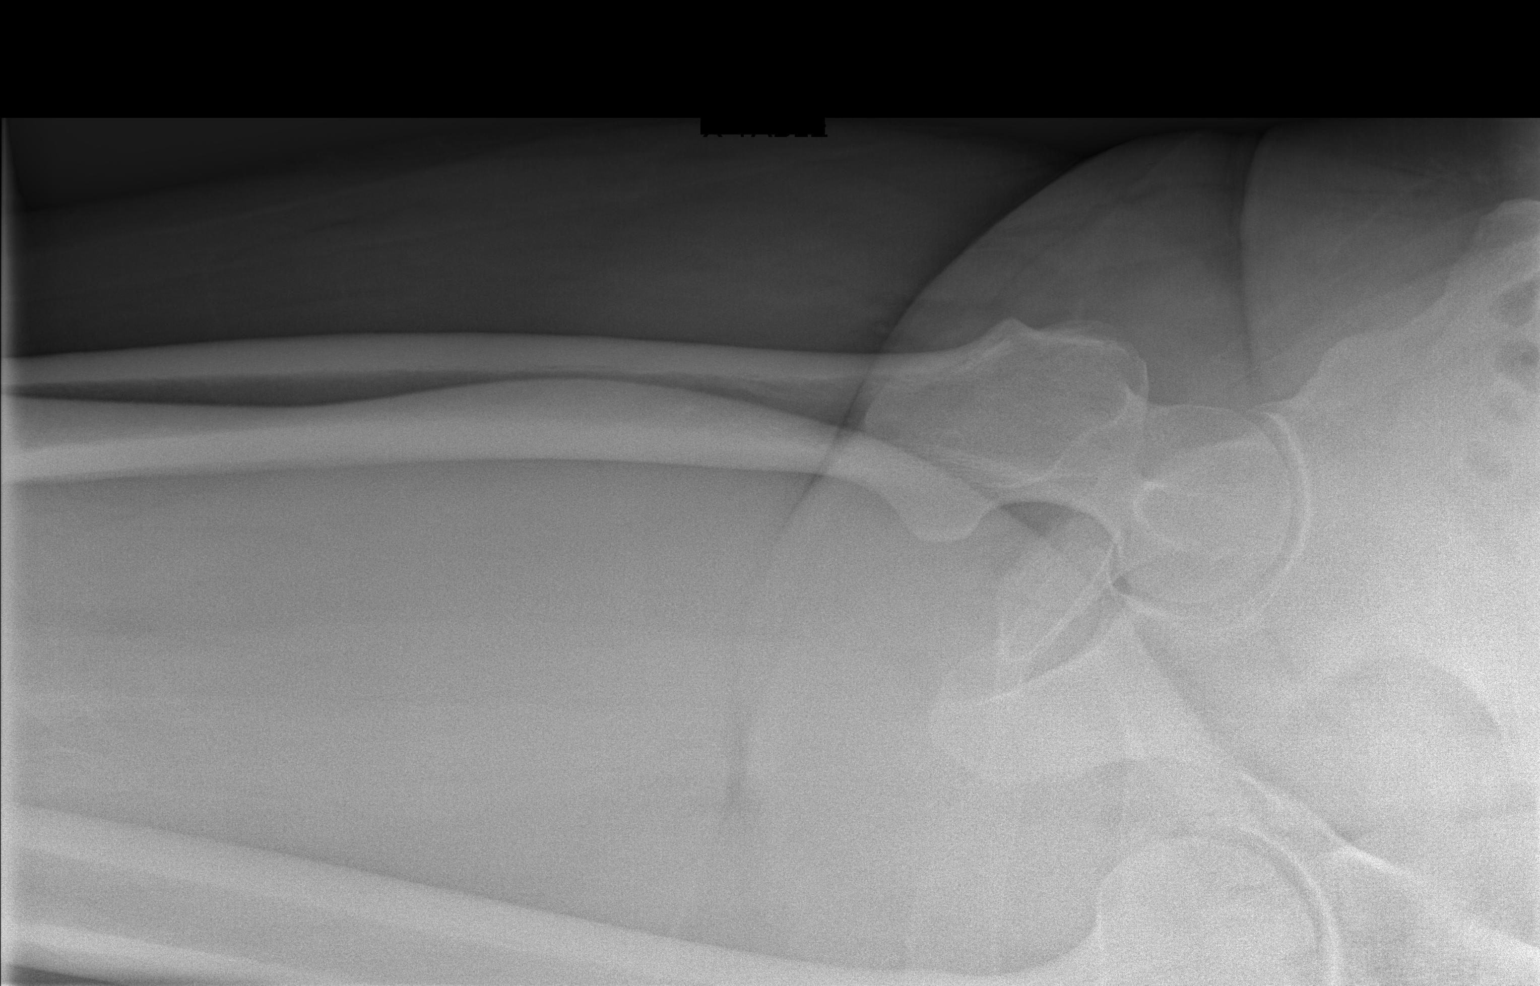

[3 of 3 positions shown; findings below may reference images not displayed]

FINDINGS: Femoral heads are well formed and located. Hip joint spaces are
intact. Sacroiliac joints are symmetric.

No destructive bony lesions. Included soft tissue planes are
non-suspicious.
IMPRESSION: Negative.

  By: Abner Yeah

## 2016-04-23 ENCOUNTER — Other Ambulatory Visit: Payer: Self-pay | Admitting: Family Medicine

## 2016-05-27 ENCOUNTER — Telehealth: Payer: Self-pay | Admitting: Family Medicine

## 2016-05-27 ENCOUNTER — Ambulatory Visit (INDEPENDENT_AMBULATORY_CARE_PROVIDER_SITE_OTHER): Payer: 59 | Admitting: Family Medicine

## 2016-05-27 VITALS — BP 102/80 | HR 119 | Temp 98.5°F | Ht 63.0 in | Wt 212.0 lb

## 2016-05-27 DIAGNOSIS — M25561 Pain in right knee: Secondary | ICD-10-CM

## 2016-05-27 NOTE — Progress Notes (Signed)
Pre visit review using our clinic review tool, if applicable. No additional management support is needed unless otherwise documented below in the visit note. 

## 2016-05-27 NOTE — Telephone Encounter (Signed)
Pt would like to have a referral to an Orthopaedic instead of going to the sport medicine doctor for the ultra sound.  Pt would like a call back.

## 2016-05-27 NOTE — Telephone Encounter (Signed)
Spoke with pt. Put a referral to see Ortho not sports medicine.

## 2016-05-27 NOTE — Patient Instructions (Signed)
We will call you with sports medicine referral with Dr Ayesha MohairZack Smith.

## 2016-05-27 NOTE — Progress Notes (Signed)
   Subjective:    Patient ID: Amy [name], female    DOB: 08/19/1976, 40 y.o.   MRN: [id number]  HPI  Acute visit for right knee pain.  Patient states about 5 weeks ago she was going down some steps at home.  Her dog apparently bumped her from behind and she fell with right leg extended and left knee hyperflexed underneath her.  She noted some immediate right knee pain along with some anterior bruising and swelling. Since that time she's had inability to fully extend the knee secondary to pain. Couple times going down stairs she's felt somewhat unstable. No locking up knee.  She applied some icing initially after the fall and has been taking ibuprofen without much improvement. Denies any prior history of significant right knee injury.  Past Medical History  Diagnosis Date  . Asthma   . Anxiety    Past Surgical History  Procedure Laterality Date  . Tubal ligation    . Bladder surgery  2010  . Shoulder surgery x 4      reports that she has been smoking Cigarettes.  She has a 3.5 pack-year smoking history. She does not have any smokeless tobacco history on file. She reports that she does not drink alcohol or use illicit drugs. family history includes Diabetes in her paternal grandfather. Allergies  Allergen Reactions  . Hydromorphone Hcl Itching and Anxiety    No opioids.      Review of Systems  Neurological: Negative for weakness and numbness.       Objective:   Physical Exam  Constitutional: She appears well-developed and well-nourished.  Cardiovascular: Normal rate and regular rhythm.   Musculoskeletal:  Right knee no effusion. No warmth. No erythema. No ecchymosis. Mild medial joint line tenderness. She has very large knee and cruciate ligament testing is difficult though we cannot appreciate any obvious instability. No collateral ligament instability.            Assessment & Plan:  Right knee pain about 5 weeks duration following fall as above. Concern is  whether she has some type of internal derangement. Feel she needs further imaging at this point. She has noted improved with conservative management- ice, rest, NSAIDS.  We recommended orthopedic assessment and she agrees with this plan.  Kristian CoveyBruce W Destani Wamser MD [facility]

## 2016-06-12 ENCOUNTER — Other Ambulatory Visit: Payer: Self-pay | Admitting: Family Medicine

## 2016-06-13 NOTE — Telephone Encounter (Signed)
Last refill #20 on 01/08/2016 No pending appt Last seen on 05/27/2016 for knee pain Please advise

## 2016-06-14 NOTE — Telephone Encounter (Signed)
Refill once.  Avoid regular use. 

## 2016-08-08 ENCOUNTER — Other Ambulatory Visit: Payer: Self-pay | Admitting: Family Medicine

## 2016-08-08 NOTE — Telephone Encounter (Signed)
Last OV 05/27/2016 Last refill 06/14/2016 Please advise

## 2016-08-09 NOTE — Telephone Encounter (Signed)
Refill once.  Avoid regular use. 

## 2016-08-09 NOTE — Telephone Encounter (Signed)
Rx called in as directed.   

## 2016-09-03 ENCOUNTER — Encounter (HOSPITAL_COMMUNITY): Payer: Self-pay

## 2016-09-03 ENCOUNTER — Emergency Department (HOSPITAL_COMMUNITY)
Admission: EM | Admit: 2016-09-03 | Discharge: 2016-09-04 | Disposition: A | Discharge status: Home / Self Care | Payer: 59 | Attending: Emergency Medicine | Admitting: Emergency Medicine

## 2016-09-03 ENCOUNTER — Emergency Department (HOSPITAL_COMMUNITY): Payer: 59

## 2016-09-03 DIAGNOSIS — Z79899 Other long term (current) drug therapy: Secondary | ICD-10-CM | POA: Insufficient documentation

## 2016-09-03 DIAGNOSIS — J45901 Unspecified asthma with (acute) exacerbation: Secondary | ICD-10-CM

## 2016-09-03 DIAGNOSIS — J9801 Acute bronchospasm: Secondary | ICD-10-CM

## 2016-09-03 DIAGNOSIS — F17210 Nicotine dependence, cigarettes, uncomplicated: Secondary | ICD-10-CM | POA: Insufficient documentation

## 2016-09-03 DIAGNOSIS — B349 Viral infection, unspecified: Secondary | ICD-10-CM

## 2016-09-03 DIAGNOSIS — R0602 Shortness of breath: Secondary | ICD-10-CM | POA: Diagnosis present

## 2016-09-03 MED ORDER — LORAZEPAM 2 MG/ML IJ SOLN
0.5000 mg | Freq: Once | INTRAMUSCULAR | Status: DC
  Filled 2016-09-03: qty 1

## 2016-09-03 MED ORDER — EPINEPHRINE 0.3 MG/0.3ML IJ SOAJ: 0.3000 mg | Freq: Once | INTRAMUSCULAR | Status: DC

## 2016-09-03 MED ORDER — SODIUM CHLORIDE 0.9 % IV BOLUS (SEPSIS)
1000.0000 mL | INTRAVENOUS | Status: AC
  Administered 2016-09-03: 1000 mL via INTRAVENOUS

## 2016-09-03 MED ORDER — ALBUTEROL SULFATE (2.5 MG/3ML) 0.083% IN NEBU
5.0000 mg | INHALATION_SOLUTION | Freq: Once | RESPIRATORY_TRACT | Status: AC
  Administered 2016-09-03: 5 mg via RESPIRATORY_TRACT

## 2016-09-03 MED ORDER — ALBUTEROL (5 MG/ML) CONTINUOUS INHALATION SOLN
10.0000 mg | INHALATION_SOLUTION | RESPIRATORY_TRACT | Status: AC
  Administered 2016-09-03: 10 mg via RESPIRATORY_TRACT
  Filled 2016-09-03: qty 20

## 2016-09-03 MED ORDER — MAGNESIUM SULFATE 2 GM/50ML IV SOLN
2.0000 g | INTRAVENOUS | Status: AC
Start: 2016-09-03 — End: 2016-09-04
  Administered 2016-09-03: 2 g via INTRAVENOUS
  Filled 2016-09-03: qty 50

## 2016-09-03 MED ORDER — METHYLPREDNISOLONE SODIUM SUCC 125 MG IJ SOLR
125.0000 mg | Freq: Once | INTRAMUSCULAR | Status: AC
  Administered 2016-09-03: 125 mg via INTRAVENOUS
  Filled 2016-09-03: qty 2

## 2016-09-03 MED ORDER — ALBUTEROL SULFATE (2.5 MG/3ML) 0.083% IN NEBU
INHALATION_SOLUTION | RESPIRATORY_TRACT | Status: AC
  Administered 2016-09-03: 5 mg via RESPIRATORY_TRACT
  Filled 2016-09-03: qty 6

## 2016-09-03 NOTE — ED Triage Notes (Signed)
Pt has a hx of asthma. States that she has taken 2 duoneb treatments at home without relief. Pt is leaning forward to breathe in triage. Unable to speak in complete sentences. A&Ox4.

## 2016-09-03 NOTE — ED Notes (Signed)
Pt on day 3 of a 5 day prednisone prescription

## 2016-09-03 NOTE — ED Provider Notes (Signed)
[facility]-EMERGENCY DEPT Provider Note   CSN: [id number] Arrival date & time: 09/03/16  2305  By signing my name below, I, Rosario Adie, attest that this documentation has been prepared under the direction and in the presence of [employer], PA-C.  Electronically Signed: Rosario Adie, ED Scribe. 09/03/16. 11:29 PM.  History   Chief Complaint Chief Complaint  Patient presents with  . Shortness of Breath   The history is provided by the patient. No language interpreter was used.   HPI Comments: Amy [name] is a 40 y.o. female with a PMhx of asthma, who presents to the Emergency Department complaining of gradual onset, constant SOB onset ~3 days ago. She reports associated expiatory wheezes, rhinorrhea, congestion, and chest/back tightness secondary to her SOB. Pt has been using 2amp Duoneb treatments with minimal relief. Pt was seen in UC ~3 days ago, where they rx'd her Prednisone which she has also been taking with minimal relief of her symptoms. She states that her SOB is mildly relieved with leaning forwards and sitting still. Pt has never been intubated for her hx of asthma; however, she has been hospitalized in the past for the flu. She is not followed by a Pulmonologist. Pt takes Advair and Zyrtec daily. Denies sore throat, or any other associated symptoms.   Past Medical History:  Diagnosis Date  . Anxiety   . Asthma    Patient Active Problem List   Diagnosis Date Noted  . Asthma exacerbation 01/08/2014  . CAP (community acquired pneumonia) 01/08/2014  . Chest tightness 01/08/2014  . Anxiety state 01/08/2014  . Influenza due to identified novel influenza A virus with other respiratory manifestations 01/08/2014  . Tobacco abuse 10/24/2013  . Acute posttraumatic stress disorder 09/13/2013  . DYSURIA 07/21/2010  . HYPERLIPIDEMIA 03/17/2010  . ATRIAL TACHYCARDIA 03/17/2010  . ASTHMA 03/17/2010  . OBESITY 06/23/2009  . DEPRESSION 06/01/2009  . 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 HEADACHE 06/01/2009   Past Surgical History:  Procedure Laterality Date  . BLADDER SURGERY  2010  . shoulder surgery x 4    . TUBAL LIGATION     OB History    No data available     Home Medications    Prior to Admission medications   Medication Sig Start Date End Date Taking? Authorizing Provider  ADVAIR DISKUS 250-50 MCG/DOSE AEPB INHALE 1 PUFF INTO THE LUNGS AT BEDTIME 11/14/15   Kristian Covey, MD  albuterol (PROVENTIL) (2.5 MG/3ML) 0.083% nebulizer solution Take 3 mLs (2.5 mg total) by nebulization every 4 (four) hours. 03/18/14 02/21/17  Gerhard Munch, MD  ALPRAZolam Prudy Feeler) 0.5 MG tablet TAKE 1 TABLET BY MOUTH 4 TIMES A DAY AS NEEDED AVOID REGULAR USE 08/09/16   Nelwyn Salisbury, MD  cetirizine (ZYRTEC) 10 MG tablet Take 10 mg by mouth daily as needed for allergies.     Historical Provider, MD  ibuprofen (ADVIL,MOTRIN) 200 MG tablet Take 400-800 mg by mouth every 6 (six) hours as needed for headache or moderate pain.    Historical Provider, MD  ipratropium-albuterol (DUONEB) 0.5-2.5 (3) MG/3ML SOLN USE BY NEBULIZATION EVERY 6 HOURS AS NEEDED 11/13/15   Kristian Covey, MD  Multiple Vitamin (MULTIVITAMIN WITH MINERALS) TABS tablet Take 1 tablet by mouth daily.    Historical Provider, MD  VENTOLIN HFA 108 (90 Base) MCG/ACT inhaler INHALE 2 PUFFS BY MOUTH INTO THE LUNGS EVERY 4 HOURS AS NEEDED FOR WHEEZING OR SHORTNESS OF BREATH 02/22/16   Kristian Covey, MD  Pauline Aus  HFA 45 MCG/ACT inhaler INHALE 1-2 PUFFS INTO THE LUNGS EVERY 4 HOURS AS NEEDED FOR WHEEZING 04/25/16   Kristian CoveyBruce W Burchette, MD   Family History Family History  Problem Relation Age of Onset  . Diabetes Paternal Grandfather    Social History Social History  Substance Use Topics  . Smoking status: Current Every Day Smoker    Packs/day: 0.50    Years: 7.00    Types: Cigarettes  . Smokeless tobacco: Not on file  . Alcohol use No   Allergies   Hydromorphone hcl  Review of Systems Review of  Systems  HENT: Positive for congestion and rhinorrhea. Negative for sore throat.   Respiratory: Positive for chest tightness, shortness of breath and wheezing.   A complete 10 system review of systems was obtained and all systems are negative except as noted in the HPI and PMH.    Physical Exam Updated Vital Signs There were no vitals taken for this visit.  Physical Exam  Constitutional: She is oriented to person, place, and time. She appears well-developed and well-nourished. No distress.  Nontoxic appearing; seeming mildly anxious.  HENT:  Head: Normocephalic and atraumatic.  Eyes: Conjunctivae and EOM are normal. No scleral icterus.  Neck: Normal range of motion.  Cardiovascular: Regular rhythm.   Tachycardia  Pulmonary/Chest: Accessory muscle usage present. Tachypnea noted. She has wheezes. She has no rales.  Dyspnea with tachypnea. There is mild accessory muscle usage. Faint wheezing in b/l upper lung fields with good air movement. Limited air movement with faint expiratory wheezing in BLL.  Musculoskeletal: Normal range of motion.  Neurological: She is alert and oriented to person, place, and time. She exhibits normal muscle tone. Coordination normal.  Ambulatory with steady gait.  Skin: Skin is warm and dry. No rash noted. She is not diaphoretic. No erythema. No pallor.  Psychiatric: She has a normal mood and affect. Her behavior is normal.  Nursing note and vitals reviewed.   ED Treatments / Results  DIAGNOSTIC STUDIES: Oxygen Saturation is 100% on RA, normal by my interpretation.   COORDINATION OF CARE: 11:28 PM-Discussed next steps with pt. Pt verbalized understanding and is agreeable with the plan.   Labs (all labs ordered are listed, but only abnormal results are displayed) Labs Reviewed  CBC WITH DIFFERENTIAL/PLATELET - Abnormal; Notable for the following:       Result Value   WBC 19.4 (*)    Neutro Abs 16.4 (*)    All other components within normal limits    BASIC METABOLIC PANEL - Abnormal; Notable for the following:    Potassium 2.8 (*)    CO2 20 (*)    Glucose, Bld 174 (*)    Calcium 8.5 (*)    All other components within normal limits  BRAIN NATRIURETIC PEPTIDE    EKG  EKG Interpretation  Date/Time:  Saturday September 03 2016 23:20:28 EDT Ventricular Rate:  126 PR Interval:    QRS Duration: 91 QT Interval:  313 QTC Calculation: 452 R Axis:   88 Text Interpretation:  Sinus tachycardia Borderline repolarization abnormality since last tracing no significant change Confirmed by Effie ShyWENTZ  MD, ELLIOTT [phone]([id number]) on 09/03/2016 11:23:53 PM      Radiology Dg Chest 2 View  Result Date: 09/03/2016 CLINICAL DATA:  Acute onset of shortness of breath. Initial encounter. EXAM: CHEST  2 VIEW COMPARISON:  Chest radiograph performed 11/25/2015 FINDINGS: The lungs are well-aerated. Mild vascular congestion is noted. Mildly increased interstitial markings may reflect mild interstitial edema. There is no  evidence of pleural effusion or pneumothorax. The heart is normal in size; the mediastinal contour is within normal limits. No acute osseous abnormalities are seen. IMPRESSION: Mild vascular congestion noted. Mildly increased interstitial markings may reflect mild interstitial edema. Electronically Signed   By: Roanna Raider M.D.   On: 09/03/2016 23:46    Procedures Procedures (including critical care time)  Medications Ordered in ED Medications  albuterol (PROVENTIL) (2.5 MG/3ML) 0.083% nebulizer solution 5 mg (5 mg Nebulization Given 09/03/16 2315)   Initial Impression / Assessment and Plan / ED Course  I have reviewed the triage vital signs and the nursing notes.  Pertinent labs & imaging results that were available during my care of the patient were reviewed by me and considered in my medical decision making (see chart for details).  Clinical Course    1:40 AM Patient feels improved following DuoNeb. She reports still needing to cough with deep  breaths. No wheezing on ausculation. Patient off DuoNeb x 20 min. Will replete K+ and monitor on RA. Suspect symptoms to be secondary to viral illness with hypokalemia due to frequent need for albuterol treatments. No PNA on CXR. Leukocytosis c/w baseline and patient recently started on prednisone. No signs of fluid overload to suggest CHF. Patient with no prior hx of this.  3:45 AM Patient still satting well on RA. Pending additional K+ infusion. Patient reports improvement in breathing. No signs of rebound. Will assess pulse ox with ambulation.  5:09 AM Patient able to ambulate without hypoxia; SpO2 95% on RA. Patient reports improvement in symptoms. She appears mildly dyspneic, but has no tachypnea. She declines observation admission. I do not believe outpatient management is unreasonable. Patient is able to be closely monitored by her spouse who is a Charity fundraiser. She has a PCP and is reliable for outpatient follow up. Tachycardia continues to improve since arrival; currently 107-110bpm on RA. This is suspected secondary to frequent nebs tonight.   CRITICAL CARE Performed by: Antony Madura   Total critical care time: 35 minutes  Critical care time was exclusive of separately billable procedures and treating other patients.  Critical care was necessary to treat or prevent imminent or life-threatening deterioration.  Critical care was time spent personally by me on the following activities: development of treatment plan with patient and/or surrogate as well as nursing, discussions with consultants, evaluation of patient's response to treatment, examination of patient, obtaining history from patient or surrogate, ordering and performing treatments and interventions, ordering and review of laboratory studies, ordering and review of radiographic studies, pulse oximetry and re-evaluation of patient's condition.   Final Clinical Impressions(s) / ED Diagnoses   Final diagnoses:  Asthma exacerbation    Acute bronchospasm due to viral infection    Vitals:   09/03/16 2358 09/04/16 0037 09/04/16 0251 09/04/16 0459  BP:  130/91 (!) 107/51 122/67  Pulse:  (!) 125 112 112  Resp:  26 20 18   Temp:   98.7 F (37.1 C) 98 F (36.7 C)  TempSrc:   Oral Oral  SpO2: 96% 98% 96% 95%    New Prescriptions New Prescriptions   PREDNISONE (DELTASONE) 20 MG TABLET    Take 2 tablets (40 mg total) by mouth daily.    I personally performed the services described in this documentation, which was scribed in my presence. The recorded information has been reviewed and is accurate.    Antony Madura, PA-C 09/04/16 0516    Antony Madura, PA-C 09/04/16 1610    Gilda Crease, MD  09/04/16 0557  

## 2016-09-03 NOTE — ED Notes (Signed)
Pt refuses to let nurse start IV till husband gets here provider informed.

## 2016-09-04 ENCOUNTER — Other Ambulatory Visit: Payer: Self-pay | Admitting: Family Medicine

## 2016-09-04 LAB — BASIC METABOLIC PANEL
ANION GAP: 9 (ref 5–15)
BUN: 9 mg/dL (ref 6–20)
CHLORIDE: 108 mmol/L (ref 101–111)
CO2: 20 mmol/L — ABNORMAL LOW (ref 22–32)
Calcium: 8.5 mg/dL — ABNORMAL LOW (ref 8.9–10.3)
Creatinine, Ser: 0.89 mg/dL (ref 0.44–1.00)
GFR calc Af Amer: >60 mL/min (ref >60)
GLUCOSE: 174 mg/dL — AB (ref 65–99)
POTASSIUM: 2.8 mmol/L — AB (ref 3.5–5.1)
Sodium: 137 mmol/L (ref 135–145)

## 2016-09-04 LAB — CBC WITH DIFFERENTIAL/PLATELET
BASOS ABS: 0 10*3/uL (ref 0.0–0.1)
Basophils Relative: 0 %
Eosinophils Absolute: 0 10*3/uL (ref 0.0–0.7)
Eosinophils Relative: 0 %
HCT: 37.1 % (ref 36.0–46.0)
HEMOGLOBIN: 12.5 g/dL (ref 12.0–15.0)
LYMPHS ABS: 2.2 10*3/uL (ref 0.7–4.0)
LYMPHS PCT: 12 %
MCH: 30 pg (ref 26.0–34.0)
MCHC: 33.7 g/dL (ref 30.0–36.0)
MCV: 89 fL (ref 78.0–100.0)
Monocytes Absolute: 0.7 10*3/uL (ref 0.1–1.0)
Monocytes Relative: 4 %
NEUTROS ABS: 16.4 10*3/uL — AB (ref 1.7–7.7)
NEUTROS PCT: 84 %
Platelets: 369 10*3/uL (ref 150–400)
RBC: 4.17 MIL/uL (ref 3.87–5.11)
RDW: 13.6 % (ref 11.5–15.5)
WBC: 19.4 10*3/uL — AB (ref 4.0–10.5)

## 2016-09-04 LAB — BRAIN NATRIURETIC PEPTIDE: B NATRIURETIC PEPTIDE 5: 60.4 pg/mL (ref 0.0–100.0)

## 2016-09-04 MED ORDER — PREDNISONE 20 MG PO TABS: 40.0000 mg | ORAL_TABLET | Freq: Every day | ORAL | 0 refills | Status: DC

## 2016-09-04 MED ORDER — POTASSIUM CHLORIDE 10 MEQ/100ML IV SOLN
10.0000 meq | INTRAVENOUS | Status: AC
  Administered 2016-09-04 (×2): 10 meq via INTRAVENOUS
  Filled 2016-09-04 (×2): qty 100

## 2016-09-04 NOTE — Discharge Instructions (Signed)
Continue taking your daily medications. Take the prednisone prescribed to you and follow this with an additional 2 day burst. Use your DuoNeb for any persistent shortness of breath. Return for any new or concerning symptoms.

## 2016-09-04 NOTE — ED Notes (Signed)
No respiratory or acute distress noted able to speak in full sentences no reaction to medication noted visitor at bedside call light in reach states feels some better.

## 2017-02-03 ENCOUNTER — Other Ambulatory Visit: Payer: Self-pay | Admitting: Family Medicine

## 2017-02-03 NOTE — Telephone Encounter (Signed)
Pt would like a refill. Please advise  

## 2017-02-03 NOTE — Telephone Encounter (Signed)
Refill for 6 months. 

## 2017-03-17 DIAGNOSIS — G8929 Other chronic pain: Secondary | ICD-10-CM | POA: Diagnosis not present

## 2017-03-17 DIAGNOSIS — M25562 Pain in left knee: Secondary | ICD-10-CM | POA: Diagnosis not present

## 2017-03-17 DIAGNOSIS — M25561 Pain in right knee: Secondary | ICD-10-CM | POA: Diagnosis not present

## 2017-03-17 DIAGNOSIS — K089 Disorder of teeth and supporting structures, unspecified: Secondary | ICD-10-CM | POA: Diagnosis not present

## 2017-03-18 DIAGNOSIS — L0390 Cellulitis, unspecified: Secondary | ICD-10-CM | POA: Diagnosis not present

## 2017-03-18 DIAGNOSIS — K047 Periapical abscess without sinus: Secondary | ICD-10-CM | POA: Diagnosis not present

## 2017-03-18 DIAGNOSIS — R6884 Jaw pain: Secondary | ICD-10-CM | POA: Diagnosis not present

## 2017-03-18 DIAGNOSIS — R609 Edema, unspecified: Secondary | ICD-10-CM | POA: Diagnosis not present

## 2017-03-18 DIAGNOSIS — R220 Localized swelling, mass and lump, head: Secondary | ICD-10-CM | POA: Diagnosis not present

## 2017-03-18 DIAGNOSIS — J45909 Unspecified asthma, uncomplicated: Secondary | ICD-10-CM | POA: Diagnosis not present

## 2017-03-30 DIAGNOSIS — M25561 Pain in right knee: Secondary | ICD-10-CM | POA: Diagnosis not present

## 2017-03-30 DIAGNOSIS — G8929 Other chronic pain: Secondary | ICD-10-CM | POA: Diagnosis not present

## 2017-03-30 DIAGNOSIS — M25562 Pain in left knee: Secondary | ICD-10-CM | POA: Diagnosis not present

## 2017-04-05 DIAGNOSIS — M25561 Pain in right knee: Secondary | ICD-10-CM | POA: Diagnosis not present

## 2017-04-05 DIAGNOSIS — G8929 Other chronic pain: Secondary | ICD-10-CM | POA: Diagnosis not present

## 2017-04-05 DIAGNOSIS — M25562 Pain in left knee: Secondary | ICD-10-CM | POA: Diagnosis not present

## 2017-04-19 ENCOUNTER — Other Ambulatory Visit: Payer: Self-pay | Admitting: Family Medicine

## 2017-05-11 DIAGNOSIS — M94262 Chondromalacia, left knee: Secondary | ICD-10-CM | POA: Diagnosis not present

## 2017-05-11 DIAGNOSIS — G8918 Other acute postprocedural pain: Secondary | ICD-10-CM | POA: Diagnosis not present

## 2017-05-11 DIAGNOSIS — S83242A Other tear of medial meniscus, current injury, left knee, initial encounter: Secondary | ICD-10-CM | POA: Diagnosis not present

## 2017-05-11 DIAGNOSIS — M23262 Derangement of other lateral meniscus due to old tear or injury, left knee: Secondary | ICD-10-CM | POA: Diagnosis not present

## 2017-05-11 DIAGNOSIS — S83282A Other tear of lateral meniscus, current injury, left knee, initial encounter: Secondary | ICD-10-CM | POA: Diagnosis not present

## 2017-05-11 DIAGNOSIS — M23222 Derangement of posterior horn of medial meniscus due to old tear or injury, left knee: Secondary | ICD-10-CM | POA: Diagnosis not present

## 2017-05-15 ENCOUNTER — Ambulatory Visit (INDEPENDENT_AMBULATORY_CARE_PROVIDER_SITE_OTHER): Payer: 59 | Admitting: Family Medicine

## 2017-05-15 ENCOUNTER — Encounter: Payer: Self-pay | Admitting: Family Medicine

## 2017-05-15 VITALS — BP 110/82 | HR 106 | Temp 97.8°F | Wt 223.0 lb

## 2017-05-15 DIAGNOSIS — J4541 Moderate persistent asthma with (acute) exacerbation: Secondary | ICD-10-CM | POA: Diagnosis not present

## 2017-05-15 DIAGNOSIS — J069 Acute upper respiratory infection, unspecified: Secondary | ICD-10-CM | POA: Diagnosis not present

## 2017-05-15 MED ORDER — PREDNISONE 10 MG PO TABS: ORAL_TABLET | ORAL | 0 refills | Status: DC

## 2017-05-15 MED ORDER — AZITHROMYCIN 250 MG PO TABS: ORAL_TABLET | ORAL | 0 refills | Status: DC

## 2017-05-15 NOTE — Progress Notes (Signed)
Patient ID: Amy [name], female   DOB: 04/22/1976, 41 y.o.   MRN: [id number] PCP: Kristian CoveyBurchette, Bruce W, MD  Subjective:  Amy [name] is a 41 y.o. year old very pleasant female patient who presents with coughing that started 3 days ago. Associated symptoms including nasal congestion, cough that is productive with yellow/green sputum, and post nasal drip are present.  Associated symptoms:  Fever 100.5 F has been reported by patient and was noted as Tmax -started: 3 days ago , symptoms are not improving and she states they are worsening -previous treatments: Duoneb TID for 2 days which has provided moderate benefit; she uses advair daily and xopenex prn when she is not feeling well -sick contacts/travel/risks: denies flu exposure.  -Hx of: allergies, asthma, left knee surgery for arthritic changes 4 days ago. She is a smoker, and reports being prone to chest congestion and exacerbations in her asthma.   No recent antibiotic therapy;   ROS-denies NVD, sore throat, tooth pain, sinus pressure/pain, fatigue  Pertinent Past Medical History- asthma,   Medications- reviewed  Current Outpatient Prescriptions  Medication Sig Dispense Refill  . ADVAIR DISKUS 250-50 MCG/DOSE AEPB INHALE 1 PUFF INTO THE LUNGS AT BEDTIME 60 each 2  . albuterol (PROVENTIL) (2.5 MG/3ML) 0.083% nebulizer solution Take 3 mLs (2.5 mg total) by nebulization every 4 (four) hours. (Patient not taking: Reported on 09/04/2016) 75 mL 3  . ALPRAZolam (XANAX) 0.5 MG tablet TAKE 1 TABLET BY MOUTH 4 TIMES A DAY AS NEEDED AVOID REGULAR USE 20 tablet 0  . cetirizine (ZYRTEC) 10 MG tablet Take 10 mg by mouth daily as needed for allergies.     Marland Kitchen. ibuprofen (ADVIL,MOTRIN) 200 MG tablet Take 400-800 mg by mouth every 6 (six) hours as needed for headache or moderate pain.    Marland Kitchen. ipratropium-albuterol (DUONEB) 0.5-2.5 (3) MG/3ML SOLN USE 3MLS BY NEBULIZATION EVERY 6 HOURS AS NEEDED 90 mL 3  . VENTOLIN HFA 108 (90 Base) MCG/ACT inhaler INHALE  2 PUFFS INTO THE LUNGS EVERY 4 HOURS AS NEEDED FOR WHEEZING OR SHORTNESS OF BREATH 18 Inhaler 1  . XOPENEX HFA 45 MCG/ACT inhaler INHALE 1-2 PUFFS INTO THE LUNGS EVERY 4 HOURS AS NEEDED FOR WHEEZING 15 Inhaler 3   No current facility-administered medications for this visit.     Objective: BP 110/82 (BP Location: Left Arm, Patient Position: Sitting, Cuff Size: Normal)   Pulse (!) 120   Temp 97.8 F (36.6 C) (Oral)   Wt 223 lb (101.2 kg)   SpO2 96%   BMI 39.50 kg/m  Gen: NAD, resting comfortably HEENT: Turbinates erythematous, TM normal, pharynx mildly erythematous with no tonsilar exudate or edema, no sinus tenderness CV: RRR no murmurs rubs or gallops Lungs: CTAB no crackles, + scattered wheezing noted bilaterally, no rhonchi Abdomen: soft/nontender/nondistended/normal bowel sounds. No rebound or guarding.  Ext: no edema Skin: warm, dry, no rash Neuro: grossly normal, moves all extremities Recheck of heart rate is noted as 106  Assessment/Plan:  1. Asthma in adult, moderate persistent, with acute exacerbation We discussed that exacerbation is most likely related to a viral URI. Patient was highly concerned that this may be bacterial in nature and with recent knee surgery is here today due to this concern. Advised patient to treat symptoms of asthma and take antibiotic only if symptoms are not improving. Further advised her to follow up with her orthopedic provider as scheduled. - predniSONE (DELTASONE) 10 MG tablet; Take 4 tablets once daily for 2 days, 3 tabs daily  for 2 days, 2 tabs daily for 2 days, 1 tab daily for 2 days.  Dispense: 20 tablet; Refill: 0 - azithromycin (ZITHROMAX) 250 MG tablet; Take 2 tablets at once today, then take one tablet daily for 4 days.  Dispense: 6 tablet; Refill: 0  2. Acute upper respiratory infection We reviewed symptoms and discussed that this is likely viral in origin with asthma exacerbation. Today, we discussed antibiotic misuse and signs of  developing a serious illness. Advised patient on supportive measures:  Get rest, drink plenty of fluids, and use tylenol as needed for pain. Follow up if fever >101, if symptoms worsen or if symptoms are not improved in 3 to 4 days. Patient verbalizes understanding.     Roddie Mc, FNP-C

## 2017-05-15 NOTE — Patient Instructions (Addendum)
It was a pleasure to see you today! Please take medication as directed and also let your orthopedic specialist know about these new medications as we discussed. Follow up if symptoms do not improve, worsen, or you develop new symptoms.   Asthma, Adult Asthma is a condition of the lungs in which the airways tighten and narrow. Asthma can make it hard to breathe. Asthma cannot be cured, but medicine and lifestyle changes can help control it. Asthma may be started (triggered) by:  Animal skin flakes (dander).  Dust.  Cockroaches.  Pollen.  Mold.  Smoke.  Cleaning products.  Hair sprays or aerosol sprays.  Paint fumes or strong smells.  Cold air, weather changes, and winds.  Crying or laughing hard.  Stress.  Certain medicines or drugs.  Foods, such as dried fruit, potato chips, and sparkling grape juice.  Infections or conditions (colds, flu).  Exercise.  Certain medical conditions or diseases.  Exercise or tiring activities. Follow these instructions at home:  Take medicine as told by your doctor.  Use a peak flow meter as told by your doctor. A peak flow meter is a tool that measures how well the lungs are working.  Record and keep track of the peak flow meter's readings.  Understand and use the asthma action plan. An asthma action plan is a written plan for taking care of your asthma and treating your attacks.  To help prevent asthma attacks:  Do not smoke. Stay away from secondhand smoke.  Change your heating and air conditioning filter often.  Limit your use of fireplaces and wood stoves.  Get rid of pests (such as roaches and mice) and their droppings.  Throw away plants if you see mold on them.  Clean your floors. Dust regularly. Use cleaning products that do not smell.  Have someone vacuum when you are not home. Use a vacuum cleaner with a HEPA filter if possible.  Replace carpet with wood, tile, or vinyl flooring. Carpet can trap animal skin  flakes and dust.  Use allergy-proof pillows, mattress covers, and box spring covers.  Wash bed sheets and blankets every week in hot water and dry them in a dryer.  Use blankets that are made of polyester or cotton.  Clean bathrooms and kitchens with bleach. If possible, have someone repaint the walls in these rooms with mold-resistant paint. Keep out of the rooms that are being cleaned and painted.  Wash hands often. Contact a doctor if:  You have make a whistling sound when breaking (wheeze), have shortness of breath, or have a cough even if taking medicine to prevent attacks.  The colored mucus you cough up (sputum) is thicker than usual.  The colored mucus you cough up changes from clear or white to yellow, green, gray, or bloody.  You have problems from the medicine you are taking such as:  A rash.  Itching.  Swelling.  Trouble breathing.  You need reliever medicines more than 2-3 times a week.  Your peak flow measurement is still at 50-79% of your personal best after following the action plan for 1 hour.  You have a fever. Get help right away if:  You seem to be worse and are not responding to medicine during an asthma attack.  You are short of breath even at rest.  You get short of breath when doing very little activity.  You have trouble eating, drinking, or talking.  You have chest pain.  You have a fast heartbeat.  Your lips or  fingernails start to turn blue.  You are light-headed, dizzy, or faint.  Your peak flow is less than 50% of your personal best. This information is not intended to replace advice given to you by your health care provider. Make sure you discuss any questions you have with your health care provider. Document Released: 05/30/2008 Document Revised: 05/19/2016 Document Reviewed: 07/11/2013 Elsevier Interactive Patient Education  2017 [employer]   NOW OFFER   [facility] FAST TRACK!!!  SAME DAY Appointments for  ACUTE CARE  Such as: Sprains, Injuries, cuts, abrasions, rashes, muscle pain, joint pain, back pain Colds, flu, sore throats, headache, allergies, cough, fever  Ear pain, sinus and eye infections Abdominal pain, nausea, vomiting, diarrhea, upset stomach Animal/insect bites  3 Easy Ways to Schedule: Walk-In Scheduling Call in scheduling Mychart Sign-up: [email or web address]

## 2017-05-31 DIAGNOSIS — M25562 Pain in left knee: Secondary | ICD-10-CM | POA: Diagnosis not present

## 2017-05-31 DIAGNOSIS — M179 Osteoarthritis of knee, unspecified: Secondary | ICD-10-CM | POA: Diagnosis not present

## 2017-06-23 ENCOUNTER — Other Ambulatory Visit: Payer: Self-pay | Admitting: Family Medicine

## 2017-07-07 ENCOUNTER — Other Ambulatory Visit: Payer: Self-pay | Admitting: Family Medicine

## 2017-07-07 NOTE — Telephone Encounter (Signed)
Last refill 05/27/16 and last office visit 08/09/16.  Okay to refill?

## 2017-07-09 NOTE — Telephone Encounter (Signed)
Refill Xopenex times three.  She does not appear to use the Xanax very often.  Would prefer she come off but may refill once.

## 2017-09-13 ENCOUNTER — Encounter: Payer: Self-pay | Admitting: Family Medicine

## 2017-09-13 ENCOUNTER — Ambulatory Visit (INDEPENDENT_AMBULATORY_CARE_PROVIDER_SITE_OTHER): Payer: 59 | Admitting: Family Medicine

## 2017-09-13 VITALS — BP 110/80 | HR 110 | Temp 98.5°F | Wt 218.0 lb

## 2017-09-13 DIAGNOSIS — J45901 Unspecified asthma with (acute) exacerbation: Secondary | ICD-10-CM | POA: Diagnosis not present

## 2017-09-13 MED ORDER — AZITHROMYCIN 250 MG PO TABS: ORAL_TABLET | ORAL | 0 refills | Status: AC

## 2017-09-13 MED ORDER — PREDNISONE 10 MG PO TABS: ORAL_TABLET | ORAL | 0 refills | Status: DC

## 2017-09-13 NOTE — Progress Notes (Signed)
Subjective:     Patient ID: Amy [name], female   DOB: 1976/02/11, 41 y.o.   MRN: [id number]  HPI Patient seen with respiratory illness. Onset yesterday. She's had some facial pain and nasal congestion along with productive cough. She is concerned because of her history of asthma. She denies any body aches. Possible subjective low-grade fevers earlier today. Denies any nausea, vomiting, or diarrhea  Past Medical History:  Diagnosis Date  . Anxiety   . Asthma    Past Surgical History:  Procedure Laterality Date  . BLADDER SURGERY  2010  . shoulder surgery x 4    . TUBAL LIGATION      reports that she has been smoking Cigarettes.  She has a 3.50 pack-year smoking history. She uses smokeless tobacco. She reports that she does not drink alcohol or use drugs. family history includes Diabetes in her paternal grandfather. Allergies  Allergen Reactions  . Hydromorphone Hcl Itching and Anxiety    No opioids.     Review of Systems  Constitutional: Positive for chills, fatigue and fever.  HENT: Positive for congestion.   Respiratory: Positive for cough and wheezing.        Objective:   Physical Exam  Constitutional: She appears well-developed and well-nourished.  HENT:  Right Ear: External ear normal.  Left Ear: External ear normal.  Mouth/Throat: Oropharynx is clear and moist.  Neck: Neck supple.  Cardiovascular: Normal rate and regular rhythm.   Pulmonary/Chest: Effort normal. No respiratory distress. She has wheezes. She has no rales.  Lymphadenopathy:    She has no cervical adenopathy.       Assessment:     Patient has long-standing history of asthma on Advair presents with couple to history of productive cough and wheezing    Plan:     -Continue Advair and continue with Ventolin inhaler as needed -Prednisone taper which she has required in the past with similar exacerbations -Prescription for Zithromax if she develops any fever or worsening symptoms -She will  get flu vaccine after she is over this acute illness  Kristian Covey MD [facility] at [facility]

## 2017-09-13 NOTE — Patient Instructions (Signed)
Follow up for any increasing shortness of breath or other concerns.

## 2017-10-27 ENCOUNTER — Other Ambulatory Visit: Payer: Self-pay | Admitting: Family Medicine

## 2017-10-27 NOTE — Telephone Encounter (Signed)
Xanax - last refill 07/10/17 and last office visit 09/13/17.  Okay to fill?

## 2017-10-29 NOTE — Telephone Encounter (Signed)
Refill OK

## 2017-10-30 DIAGNOSIS — Z23 Encounter for immunization: Secondary | ICD-10-CM | POA: Diagnosis not present

## 2017-10-30 NOTE — Telephone Encounter (Signed)
Sent to PCP for approval of xanax Rx.

## 2017-10-30 NOTE — Telephone Encounter (Signed)
Already answered.  Refill once.  Avoid regular use.

## 2017-11-25 ENCOUNTER — Other Ambulatory Visit: Payer: Self-pay | Admitting: Family Medicine

## 2018-01-01 ENCOUNTER — Telehealth: Payer: Self-pay | Admitting: *Deleted

## 2018-01-01 NOTE — Telephone Encounter (Signed)
Patient Name: Amy [name] Gender: Female DOB: 01/19/1976 Age: [age] Y 9 M 3 D Return Phone Number: [phone] (Primary) Address: [place] [id number] Client [facility] Night - Client Client Site [facility] - Night Physician Evelena PeatBurchette, Bruce - MD Contact Type Call Who Is Calling Patient / Member / Family / Caregiver Call Type Triage / Clinical Relationship To Patient Self Return Phone Number [phone] (Primary) Chief Complaint Prescription Refill or Medication Request (non symptomatic) Reason for Call Symptomatic / Request for Health Information Initial Comment Caller states her husband was just diagnosed with flu type A at urgent care. She says she was told by urgent care to call and request Tamiflu. She denies having any symptoms right now but wants to protect herself from acquiring the flu from husband. Translation No Nurse Assessment Nurse: Mills-Hernandez, RN, Harriett SineNancy Date/Time (Eastern Time): 12/31/2017 5:14:03 PM Confirm and document reason for call. If symptomatic, describe symptoms. ---Caller states that her husband was just dx with Flu A at urgent care and the doctor suggested she phone in for Tamiflu for herself. She is not having any symptoms. Does the patient have any new or worsening symptoms? ---No Guidelines Guideline Title Affirmed Question Affirmed Notes Nurse Date/Time (Eastern Time) Disp. Time Lamount Cohen(Eastern Time) Disposition Final User 12/31/2017 5:16:52 PM Clinical Call Yes Mills-Hernandez, RN, Nanc

## 2018-01-01 NOTE — Telephone Encounter (Signed)
Spoke with patient and Tamiflu not needed at this time

## 2018-01-01 NOTE — Telephone Encounter (Signed)
tamiflu 75 mg po qd for 10 days. 

## 2018-01-01 NOTE — Telephone Encounter (Signed)
Patient called [facility] yesterday requesting prophylactic Tamiflu due to husband being diagnosed w/ flu. Please advise.

## 2018-03-16 ENCOUNTER — Other Ambulatory Visit: Payer: Self-pay | Admitting: Family Medicine

## 2018-04-20 ENCOUNTER — Other Ambulatory Visit: Payer: Self-pay | Admitting: Family Medicine

## 2018-05-23 ENCOUNTER — Other Ambulatory Visit: Payer: Self-pay | Admitting: Family Medicine

## 2018-08-12 DIAGNOSIS — N390 Urinary tract infection, site not specified: Secondary | ICD-10-CM | POA: Diagnosis not present

## 2018-08-26 DIAGNOSIS — R05 Cough: Secondary | ICD-10-CM | POA: Diagnosis not present

## 2018-08-26 DIAGNOSIS — J4521 Mild intermittent asthma with (acute) exacerbation: Secondary | ICD-10-CM | POA: Diagnosis not present

## 2018-11-15 DIAGNOSIS — S7001XA Contusion of right hip, initial encounter: Secondary | ICD-10-CM | POA: Diagnosis not present

## 2018-11-15 DIAGNOSIS — S80211A Abrasion, right knee, initial encounter: Secondary | ICD-10-CM | POA: Diagnosis not present

## 2018-11-21 ENCOUNTER — Other Ambulatory Visit: Payer: Self-pay | Admitting: Family Medicine

## 2018-12-05 ENCOUNTER — Encounter: Payer: Self-pay | Admitting: Family Medicine

## 2018-12-10 ENCOUNTER — Other Ambulatory Visit: Payer: Self-pay

## 2018-12-10 ENCOUNTER — Encounter: Payer: Self-pay | Admitting: Family Medicine

## 2018-12-10 ENCOUNTER — Ambulatory Visit (INDEPENDENT_AMBULATORY_CARE_PROVIDER_SITE_OTHER): Payer: 59 | Admitting: Family Medicine

## 2018-12-10 VITALS — BP 126/82 | HR 112 | Temp 98.0°F | Ht 60.75 in | Wt 236.9 lb

## 2018-12-10 DIAGNOSIS — Z0000 Encounter for general adult medical examination without abnormal findings: Secondary | ICD-10-CM

## 2018-12-10 DIAGNOSIS — Z23 Encounter for immunization: Secondary | ICD-10-CM | POA: Diagnosis not present

## 2018-12-10 MED ORDER — BUPROPION HCL ER (XL) 150 MG PO TB24: 150.0000 mg | ORAL_TABLET | Freq: Every day | ORAL | 5 refills | Status: DC

## 2018-12-10 NOTE — Progress Notes (Signed)
Subjective:     Patient ID: Amy [name], female   DOB: 06/17/1976, 42 y.o.   MRN: [id number]  HPI Patient is seen for well visit.  She plans to establish with new gynecologist later- early next year.  She plans to get baseline mammogram and Pap smears through them.  Still needs flu vaccine.  She has history of asthma, nicotine use (quit this February), dyslipidemia, obesity, acute posttraumatic stress disorder.  Her main concern is weight gain and difficulty losing weight.  She is had some ongoing knee issues and had left knee arthroscopy and was told she has some arthritic changes and may need eventual knee replacement.  She has had some ongoing issues with low-grade depression.  She has taken citalopram in the past but had problems with low libido.  She would like to explore potential options for treating depression that would not reduce her libido.  No suicidal ideation.  Family history reviewed.  Maternal grand mother had breast cancer and she had an aunt with breast cancer as well.  Several members on father side have had type 2 diabetes  Past Medical History:  Diagnosis Date  . Anxiety   . Asthma    Past Surgical History:  Procedure Laterality Date  . BLADDER SURGERY  2010  . KNEE SURGERY  April/May of 2018   Left knee  . shoulder surgery x 4    . TUBAL LIGATION      reports that she has been smoking cigarettes. She has a 3.50 pack-year smoking history. She uses smokeless tobacco. She reports that she does not drink alcohol or use drugs. family history includes Diabetes in her paternal grandfather. Allergies  Allergen Reactions  . Morphine And Related Itching  . Hydromorphone Hcl Itching and Anxiety    No opioids.      Review of Systems  Constitutional: Positive for fatigue. Negative for appetite change and unexpected weight change.  Respiratory: Negative for shortness of breath.   Cardiovascular: Negative for chest pain and palpitations.  Gastrointestinal:  Negative for abdominal pain.  Genitourinary: Negative for dysuria.  Neurological: Negative for headaches.  Psychiatric/Behavioral: Positive for dysphoric mood. Negative for agitation, confusion and suicidal ideas. The patient is nervous/anxious.        Objective:   Physical Exam Constitutional:      Appearance: Normal appearance.  HENT:     Right Ear: Tympanic membrane normal.     Left Ear: Tympanic membrane normal.     Mouth/Throat:     Pharynx: No oropharyngeal exudate or posterior oropharyngeal erythema.  Neck:     Musculoskeletal: Neck supple.  Cardiovascular:     Rate and Rhythm: Normal rate and regular rhythm.     Pulses: Normal pulses.     Heart sounds: Normal heart sounds.  Abdominal:     Palpations: Abdomen is soft. There is no mass.     Tenderness: There is no abdominal tenderness. There is no guarding or rebound.  Musculoskeletal:     Right lower leg: No edema.     Left lower leg: No edema.  Lymphadenopathy:     Cervical: No cervical adenopathy.  Neurological:     Mental Status: She is alert.  Psychiatric:        Mood and Affect: Mood normal.        Assessment:     Physical exam.  Patient has history of obesity but fortunately no history of hypertension or diabetes.  She does have positive family history of type 2 diabetes  as above and is at high risk.  She has had some ongoing low-grade depression for several years and would like to explore options for treating that would not impair her libido    Plan:     -Check screening labs and she will return fasting for those -Discussed healthy weight loss strategies.  She is currently doing calorie counting -Discussed trial of low-dose Wellbutrin XL 150 mg once daily but need to watch and make sure this does not elevate her pulse which tends to be elevated slightly at baseline -Bring back in 1 month to reassess -Flu vaccine given -She will be setting up GYN follow-up  Kristian Covey MD [facility] at  [facility]

## 2018-12-14 ENCOUNTER — Other Ambulatory Visit (INDEPENDENT_AMBULATORY_CARE_PROVIDER_SITE_OTHER): Payer: 59

## 2018-12-14 DIAGNOSIS — Z0000 Encounter for general adult medical examination without abnormal findings: Secondary | ICD-10-CM | POA: Diagnosis not present

## 2018-12-14 LAB — LIPID PANEL
CHOL/HDL RATIO: 6
Cholesterol: 259 mg/dL — ABNORMAL HIGH (ref 0–200)
HDL: 41.6 mg/dL (ref >39.00)
LDL CALC: 186 mg/dL — AB (ref 0–99)
NONHDL: 217.74
Triglycerides: 157 mg/dL — ABNORMAL HIGH (ref 0.0–149.0)
VLDL: 31.4 mg/dL (ref 0.0–40.0)

## 2018-12-14 LAB — BASIC METABOLIC PANEL
BUN: 12 mg/dL (ref 6–23)
CO2: 27 mEq/L (ref 19–32)
Calcium: 9.2 mg/dL (ref 8.4–10.5)
Chloride: 104 mEq/L (ref 96–112)
Creatinine, Ser: 0.94 mg/dL (ref 0.40–1.20)
GFR: 69.85 mL/min (ref >60.00)
Glucose, Bld: 96 mg/dL (ref 70–99)
POTASSIUM: 4.8 meq/L (ref 3.5–5.1)
SODIUM: 139 meq/L (ref 135–145)

## 2018-12-14 LAB — HEPATIC FUNCTION PANEL
ALK PHOS: 65 U/L (ref 39–117)
ALT: 10 U/L (ref 0–35)
AST: 13 U/L (ref 0–37)
Albumin: 4.1 g/dL (ref 3.5–5.2)
BILIRUBIN DIRECT: 0.1 mg/dL (ref 0.0–0.3)
BILIRUBIN TOTAL: 0.4 mg/dL (ref 0.2–1.2)
Total Protein: 6.7 g/dL (ref 6.0–8.3)

## 2018-12-14 LAB — CBC WITH DIFFERENTIAL/PLATELET
BASOS PCT: 0.8 % (ref 0.0–3.0)
Basophils Absolute: 0.1 10*3/uL (ref 0.0–0.1)
EOS PCT: 3.9 % (ref 0.0–5.0)
Eosinophils Absolute: 0.4 10*3/uL (ref 0.0–0.7)
HEMATOCRIT: 39.9 % (ref 36.0–46.0)
HEMOGLOBIN: 13.5 g/dL (ref 12.0–15.0)
LYMPHS PCT: 27.5 % (ref 12.0–46.0)
Lymphs Abs: 2.8 10*3/uL (ref 0.7–4.0)
MCHC: 33.8 g/dL (ref 30.0–36.0)
MCV: 85.8 fl (ref 78.0–100.0)
MONO ABS: 0.6 10*3/uL (ref 0.1–1.0)
Monocytes Relative: 5.7 % (ref 3.0–12.0)
NEUTROS ABS: 6.4 10*3/uL (ref 1.4–7.7)
Neutrophils Relative %: 62.1 % (ref 43.0–77.0)
Platelets: 391 10*3/uL (ref 150.0–400.0)
RBC: 4.65 Mil/uL (ref 3.87–5.11)
RDW: 14.1 % (ref 11.5–15.5)
WBC: 10.3 10*3/uL (ref 4.0–10.5)

## 2018-12-14 LAB — TSH: TSH: 3.01 u[IU]/mL (ref 0.35–4.50)

## 2018-12-18 ENCOUNTER — Other Ambulatory Visit: Payer: Self-pay | Admitting: Family Medicine

## 2018-12-20 ENCOUNTER — Other Ambulatory Visit: Payer: Self-pay | Admitting: Family Medicine

## 2018-12-20 ENCOUNTER — Telehealth: Payer: Self-pay

## 2018-12-20 NOTE — Telephone Encounter (Signed)
Copied from CRM [id number]. Topic: General - Other >> Dec 20, 2018 11:29 AM Floria [name], Amy A wrote: Reason for CRM:  pt called in and requesting a call back to get her lab results from last week>  12/20   Best number  [phone]

## 2018-12-20 NOTE — Telephone Encounter (Signed)
Last OV 12/10/18, Next OV 01/09/2019  Last filled 11/5/818, # 20 with 0 refills

## 2018-12-20 NOTE — Telephone Encounter (Signed)
Do not recommend taking regularly- especially with some data associating possible memory loss with long term use.

## 2018-12-27 NOTE — Telephone Encounter (Signed)
Patient was given lab results on 12/20/18.

## 2019-01-09 ENCOUNTER — Ambulatory Visit: Payer: 59 | Admitting: Family Medicine

## 2019-01-09 ENCOUNTER — Encounter: Payer: Self-pay | Admitting: Family Medicine

## 2019-01-09 VITALS — BP 108/80 | HR 101 | Temp 98.3°F | Ht 60.75 in

## 2019-01-09 DIAGNOSIS — F339 Major depressive disorder, recurrent, unspecified: Secondary | ICD-10-CM | POA: Diagnosis not present

## 2019-01-09 DIAGNOSIS — Z23 Encounter for immunization: Secondary | ICD-10-CM

## 2019-01-09 MED ORDER — ALPRAZOLAM 0.5 MG PO TABS: ORAL_TABLET | ORAL | 0 refills | Status: DC

## 2019-01-09 MED ORDER — BUPROPION HCL ER (XL) 300 MG PO TB24: 300.0000 mg | ORAL_TABLET | Freq: Every day | ORAL | 11 refills | Status: DC

## 2019-01-09 NOTE — Patient Instructions (Signed)
Give me some feedback in one month regarding your depression (after increase in the Wellbutrin).

## 2019-01-09 NOTE — Progress Notes (Signed)
  Subjective:     Patient ID: Amy [name], female   DOB: 1976/07/29, 43 y.o.   MRN: [id number]  HPI Patient seen for follow-up regarding depression symptoms.  Refer to recent note for details.  She had previously been on citalopram but did not feel this was very effective for treating her depression and she had decreased libido.  We started Wellbutrin XL 150 mg daily and thus far she has not seen much improvement in depression symptoms.  No side effects from medication.  She declines PHQ 9 today.  She has had difficulty sleeping which she thinks is a Probation officer for her depression.  No caffeine use.  No alcohol use.  No suicidal ideation.  She does have some anxiety symptoms and takes alprazolam very infrequently  She has made some dietary changes and thus far has lost about 5 pounds  Past Medical History:  Diagnosis Date  . Anxiety   . Asthma    Past Surgical History:  Procedure Laterality Date  . BLADDER SURGERY  2010  . KNEE SURGERY  April/May of 2018   Left knee  . shoulder surgery x 4    . TUBAL LIGATION      reports that she has quit smoking. Her smoking use included cigarettes. She has a 3.50 pack-year smoking history. She uses smokeless tobacco. She reports that she does not drink alcohol or use drugs. family history includes Diabetes in her paternal grandfather. Allergies  Allergen Reactions  . Morphine And Related Itching  . Hydromorphone Hcl Itching and Anxiety    No opioids.     Review of Systems  Psychiatric/Behavioral: Positive for dysphoric mood and sleep disturbance. Negative for agitation and suicidal ideas.       Objective:   Physical Exam Constitutional:      Appearance: Normal appearance.  Neurological:     Mental Status: She is alert.  Psychiatric:        Mood and Affect: Mood normal.        Thought Content: Thought content normal.        Assessment:     Longstanding history of recurrent depression.  She has not seen much improvement thus far  with low-dose Wellbutrin    Plan:     -Increase Wellbutrin XL to 300 mg daily -She will give me some feedback by phone call or by email within the next 4 weeks or so -We have encouraged her to stay engaged with exercise much as possible -pt also needs tetanus booster and this was given today.  Kristian Covey MD [facility] at [facility]

## 2019-03-16 ENCOUNTER — Other Ambulatory Visit: Payer: Self-pay | Admitting: Family Medicine

## 2019-03-18 NOTE — Telephone Encounter (Signed)
Last OV 01/09/19, No future OV  Last filled Xanax 01/09/19, # 20 with 0 refills  Last filled Xopenex, 07/10/2017, 2 inhalers with 3 refills

## 2019-03-18 NOTE — Telephone Encounter (Signed)
Refill Xopenex with one additional refill     Refill Xanax once.   Avoid regular use.

## 2019-03-22 ENCOUNTER — Other Ambulatory Visit: Payer: Self-pay | Admitting: Family Medicine

## 2019-03-23 ENCOUNTER — Other Ambulatory Visit: Payer: Self-pay | Admitting: Family Medicine

## 2019-03-25 NOTE — Telephone Encounter (Signed)
sent 

## 2019-03-25 NOTE — Telephone Encounter (Signed)
This did not go through from the fax I sent in last week. Please send. Thanks!

## 2019-03-26 NOTE — Telephone Encounter (Signed)
Last filled in 2018. Please advise if refill OK and dosage with instructions. Thanks!

## 2019-03-26 NOTE — Telephone Encounter (Signed)
OK to refill once.  Take 2 puffs every 4 hours as needed for cough and wheeze.

## 2019-03-28 ENCOUNTER — Other Ambulatory Visit: Payer: Self-pay

## 2019-03-28 MED ORDER — ALBUTEROL SULFATE HFA 108 (90 BASE) MCG/ACT IN AERS: 2.0000 | INHALATION_SPRAY | RESPIRATORY_TRACT | 0 refills | Status: DC | PRN

## 2019-04-09 ENCOUNTER — Other Ambulatory Visit: Payer: Self-pay | Admitting: Family Medicine

## 2019-04-22 ENCOUNTER — Other Ambulatory Visit: Payer: Self-pay | Admitting: Family Medicine

## 2019-05-04 ENCOUNTER — Other Ambulatory Visit: Payer: Self-pay | Admitting: Family Medicine

## 2019-07-29 ENCOUNTER — Other Ambulatory Visit: Payer: Self-pay | Admitting: Family Medicine

## 2019-07-30 NOTE — Telephone Encounter (Signed)
Last OV 01/09/19, No future OV  Last filled 03/25/19, # 20 with 0 refills

## 2019-11-20 ENCOUNTER — Other Ambulatory Visit: Payer: Self-pay | Admitting: Family Medicine

## 2019-12-12 ENCOUNTER — Other Ambulatory Visit: Payer: Self-pay | Admitting: Family Medicine

## 2020-02-17 ENCOUNTER — Telehealth: Payer: Self-pay | Admitting: Family Medicine

## 2020-02-17 NOTE — Telephone Encounter (Signed)
That is good with me.  Thanks!

## 2020-02-17 NOTE — Telephone Encounter (Signed)
Please advise 

## 2020-02-17 NOTE — Telephone Encounter (Signed)
Pt called requesting TOC to Dr. Jimmey Ralph at [facility]. Please advise.

## 2020-02-18 NOTE — Telephone Encounter (Signed)
Called and LVM to schedule TOC appt 

## 2020-02-18 NOTE — Telephone Encounter (Signed)
Noted  

## 2020-02-18 NOTE — Telephone Encounter (Signed)
That is ok with me but please make sure patient is aware that new patient appointments and TOC visits are currently booked several weeks out.  Katina Degree. Jimmey Ralph, MD 02/18/2020 8:02 AM

## 2020-04-07 ENCOUNTER — Other Ambulatory Visit: Payer: Self-pay | Admitting: Family Medicine

## 2020-04-22 ENCOUNTER — Other Ambulatory Visit: Payer: Self-pay | Admitting: Family Medicine

## 2020-07-31 ENCOUNTER — Other Ambulatory Visit: Payer: Self-pay | Admitting: Family Medicine

## 2020-08-03 ENCOUNTER — Other Ambulatory Visit: Payer: Self-pay

## 2020-08-03 ENCOUNTER — Telehealth (INDEPENDENT_AMBULATORY_CARE_PROVIDER_SITE_OTHER): Payer: 59 | Admitting: Family Medicine

## 2020-08-03 DIAGNOSIS — F411 Generalized anxiety disorder: Secondary | ICD-10-CM

## 2020-08-03 DIAGNOSIS — F339 Major depressive disorder, recurrent, unspecified: Secondary | ICD-10-CM | POA: Diagnosis not present

## 2020-08-03 MED ORDER — ALPRAZOLAM 1 MG PO TABS: 1.0000 mg | ORAL_TABLET | Freq: Two times a day (BID) | ORAL | 0 refills | Status: DC | PRN

## 2020-08-03 MED ORDER — FLUOXETINE HCL 20 MG PO TABS: 20.0000 mg | ORAL_TABLET | Freq: Every day | ORAL | 3 refills | Status: DC

## 2020-08-03 NOTE — Progress Notes (Signed)
Patient ID: Amy [name], female   DOB: [birth year], 44 y.o.   MRN: [id number]  This visit type was conducted due to national recommendations for restrictions regarding the COVID-19 pandemic in an effort to limit this patient's exposure and mitigate transmission in our community.   Virtual Visit via Video Note  I connected with Amy [name] on 08/03/20 at  4:30 PM EDT by a video enabled telemedicine application and verified that I am speaking with the correct person using two identifiers.  Location patient: home Location provider:work or home office Persons participating in the virtual visit: patient, provider  I discussed the limitations of evaluation and management by telemedicine and the availability of in person appointments. The patient expressed understanding and agreed to proceed.   HPI: Amy [name] had called earlier today for refills of alprazolam which she has used infrequently in the past for panic type symptoms. She has had mixture of anxiety type symptoms in the past with some situational but also what sounds like panic disorder and posttraumatic stress symptoms. She tries to use alprazolam infrequently. She previously been on Wellbutrin for depression but did not feel this worked and also had increased side effects. She has a friend who works in psychiatry field who had suggested consideration for Prozac both to help with her depression and anxiety symptoms. She has not had any recent suicidal ideation.  She has considerable stress related to work and also during the past year with the pandemic. She has noticed recently that when taking alprazolam 0.5 mg and seems to have less the fact that it used to. She tries to use this again very sparingly and infrequently   ROS: See pertinent positives and negatives per HPI.  Past Medical History:  Diagnosis Date  . Anxiety   . Asthma     Past Surgical History:  Procedure Laterality Date  . BLADDER SURGERY  2010  . KNEE SURGERY  April/May  of 2018   Left knee  . shoulder surgery x 4    . TUBAL LIGATION      Family History  Problem Relation Age of Onset  . Diabetes Paternal Grandfather     SOCIAL HX: She is married. No excessive alcohol use.   Current Outpatient Medications:  .  ADVAIR DISKUS 250-50 MCG/DOSE AEPB, INHALE 1 PUFF INTO THE LUNGS AT BEDTIME., Disp: 60 each, Rfl: 0 .  albuterol (PROVENTIL) (2.5 MG/3ML) 0.083% nebulizer solution, Take 3 mLs (2.5 mg total) by nebulization every 4 (four) hours., Disp: 75 mL, Rfl: 3 .  ALPRAZolam (XANAX) 1 MG tablet, Take 1 tablet (1 mg total) by mouth 2 (two) times daily as needed for anxiety., Disp: 20 tablet, Rfl: 0 .  cetirizine (ZYRTEC) 10 MG tablet, Take 10 mg by mouth daily as needed for allergies. , Disp: , Rfl:  .  FLUoxetine (PROZAC) 20 MG tablet, Take 1 tablet (20 mg total) by mouth daily., Disp: 90 tablet, Rfl: 3 .  ibuprofen (ADVIL,MOTRIN) 200 MG tablet, Take 400-800 mg by mouth every 6 (six) hours as needed for headache or moderate pain., Disp: , Rfl:  .  ipratropium-albuterol (DUONEB) 0.5-2.5 (3) MG/3ML SOLN, USE 1 VIAL BY NEBULIZATION EVERY 6 HOURS AS NEEDED, Disp: 90 mL, Rfl: 2 .  VENTOLIN HFA 108 (90 Base) MCG/ACT inhaler, INHALE 2 PUFFS INTO THE LUNGS EVERY 4 HOURS AS NEEDED FOR WHEEZING, SHORTNESS OF BREATH, OR COUGH, Disp: 18 g, Rfl: 0  EXAM:  VITALS per patient if applicable:  GENERAL: alert, oriented, appears well and in  no acute distress  HEENT: atraumatic, conjunttiva clear, no obvious abnormalities on inspection of external nose and ears  NECK: normal movements of the head and neck  LUNGS: on inspection no signs of respiratory distress, breathing rate appears normal, no obvious gross SOB, gasping or wheezing  CV: no obvious cyanosis  MS: moves all visible extremities without noticeable abnormality  PSYCH/NEURO: pleasant and cooperative, no obvious depression or anxiety, speech and thought processing grossly intact  ASSESSMENT AND  PLAN:  Discussed the following assessment and plan:  Mixed anxiety and depression symptoms. Patient has had recurrent depression for several years and does relate some progression of depression symptoms recently and would like to consider trial of Prozac both to address her depression and anxiety issues  -We discussed starting Prozac 20 mg once daily -Touch base give [place] some feedback in a few weeks how things are going with this dosage -We wrote for limited alprazolam 1 mg to take 1 every 8 hours as needed for severe anxiety #20 with no refill     I discussed the assessment and treatment plan with the patient. The patient was provided an opportunity to ask questions and all were answered. The patient agreed with the plan and demonstrated an understanding of the instructions.   The patient was advised to call back or seek an in-person evaluation if the symptoms worsen or if the condition fails to improve as anticipated.     Evelena Peat, MD

## 2020-08-03 NOTE — Telephone Encounter (Signed)
Needs follow up. Not seen in one year.  

## 2020-08-03 NOTE — Telephone Encounter (Signed)
Please advise 

## 2020-08-03 NOTE — Telephone Encounter (Signed)
Appt set up

## 2020-08-23 ENCOUNTER — Other Ambulatory Visit: Payer: Self-pay | Admitting: Family Medicine

## 2020-11-24 ENCOUNTER — Other Ambulatory Visit: Payer: Self-pay | Admitting: Family Medicine

## 2021-05-09 ENCOUNTER — Other Ambulatory Visit: Payer: Self-pay | Admitting: Family Medicine

## 2021-05-10 NOTE — Telephone Encounter (Signed)
Last filled 08/03/2020 Last OV 08/03/2020 Due for a physical.  Ok to fill?

## 2021-05-17 ENCOUNTER — Telehealth: Payer: Self-pay | Admitting: Family Medicine

## 2021-05-17 NOTE — Telephone Encounter (Signed)
Called patient to schedule annual physical.  No answer, left voicemail message stating to call back so we can get her scheduled for her physical.

## 2021-10-24 ENCOUNTER — Other Ambulatory Visit: Payer: Self-pay | Admitting: Family Medicine

## 2021-10-25 NOTE — Telephone Encounter (Signed)
Last OV 05/10/2021 Last OV 08/03/2020  Ok to fill?
